# Patient Record
Sex: Female | Born: 1965 | Race: White | Hispanic: No | Marital: Married | Smoking: Never smoker
Health system: Southern US, Community
[De-identification: ages and names within clinical notes are randomized; demographics above are authoritative.]

## PROBLEM LIST (undated history)

## (undated) DIAGNOSIS — I10 Essential (primary) hypertension: Secondary | ICD-10-CM

## (undated) DIAGNOSIS — K219 Gastro-esophageal reflux disease without esophagitis: Secondary | ICD-10-CM

## (undated) DIAGNOSIS — E785 Hyperlipidemia, unspecified: Secondary | ICD-10-CM

## (undated) DIAGNOSIS — F32A Depression, unspecified: Secondary | ICD-10-CM

## (undated) DIAGNOSIS — F419 Anxiety disorder, unspecified: Secondary | ICD-10-CM

## (undated) HISTORY — DX: Essential (primary) hypertension: I10

## (undated) HISTORY — DX: Gastro-esophageal reflux disease without esophagitis: K21.9

## (undated) HISTORY — PX: ABDOMINAL HYSTERECTOMY: SHX81

## (undated) HISTORY — DX: Hyperlipidemia, unspecified: E78.5

---

## 2021-05-29 ENCOUNTER — Other Ambulatory Visit: Payer: Self-pay | Admitting: Surgery

## 2021-05-29 ENCOUNTER — Other Ambulatory Visit (HOSPITAL_COMMUNITY): Payer: Self-pay | Admitting: Surgery

## 2021-05-29 DIAGNOSIS — K219 Gastro-esophageal reflux disease without esophagitis: Secondary | ICD-10-CM

## 2021-05-29 DIAGNOSIS — I10 Essential (primary) hypertension: Secondary | ICD-10-CM

## 2021-05-29 DIAGNOSIS — Z6835 Body mass index (BMI) 35.0-35.9, adult: Secondary | ICD-10-CM

## 2021-05-29 DIAGNOSIS — E785 Hyperlipidemia, unspecified: Secondary | ICD-10-CM

## 2021-06-26 ENCOUNTER — Telehealth: Payer: Self-pay | Admitting: Neurology

## 2021-06-26 NOTE — Telephone Encounter (Signed)
Sleep consult cancelled due to MD out, VM box full/mychart msg sent informing pt.

## 2021-07-09 ENCOUNTER — Encounter: Payer: BC Managed Care – PPO | Attending: Surgery | Admitting: Skilled Nursing Facility1

## 2021-07-09 ENCOUNTER — Encounter: Payer: Self-pay | Admitting: Skilled Nursing Facility1

## 2021-07-09 ENCOUNTER — Encounter: Payer: Self-pay | Admitting: Neurology

## 2021-07-09 ENCOUNTER — Other Ambulatory Visit: Payer: Self-pay

## 2021-07-09 DIAGNOSIS — E669 Obesity, unspecified: Secondary | ICD-10-CM | POA: Insufficient documentation

## 2021-07-09 NOTE — Progress Notes (Signed)
Nutrition Assessment for Bariatric Surgery Medical Nutrition Therapy Appt Start Time: 3:07    End Time: 4:08  Patient was seen on 07/09/2021 for Pre-Operative Nutrition Assessment. Letter of approval faxed to [facility] bariatric surgery program coordinator on 07/09/2021.   Referral stated Supervised Weight Loss (SWL) visits needed: 0  Pt completed visits.   Pt has cleared nutrition requirements.   Planned surgery: sleeve gastrectomy  Pt expectation of surgery: to lose weight Pt expectation of dietitian: none identified     NUTRITION ASSESSMENT   Anthropometrics  Start weight at [facility]: 190 lbs (date: 07/09/2021)  Height: 60 in BMI: 37.11 kg/m2     Clinical  Medical hx: GERD, hyperlipidemia, HTN Medications: see list; omeprazole   Labs: total cholesterol 351, triglycerides 252, A1C 5.8, vitamin D 26.7 Notable signs/symptoms: constipation  Any previous deficiencies? No  Micronutrient Nutrition Focused Physical Exam: Hair: No issues observed Eyes: No issues observed Mouth: No issues observed Neck: No issues observed Nails: No issues observed Skin: No issues observed  Lifestyle & Dietary Hx  Pt states she has to take her reflux medicine every day and if she does not she will have very bad reflux.   Pt states she has started eating probiotic yogurt + granola which has helped her have more consistent bowel habits.  Pt states she recognizes her excess weight is due to larger portion sizes.   24-Hr Dietary Recall First Meal: protein shake Snack:  Second Meal: probiotic yogurt + protein granola or salad  Snack: grapes Third Meal: chicken or pork chop or manicotti + salad Snack: popcorn or chips or ice cream Beverages: coffee + cream + sugar, unsweet tea, water, diet soda   Estimated Energy Needs Calories: 1500   NUTRITION DIAGNOSIS  Overweight/obesity (Silo-3.3) related to past poor dietary habits and physical inactivity as evidenced by patient w/ planned  sleeve gastrectomy surgery following dietary guidelines for continued weight loss.    NUTRITION INTERVENTION  Nutrition counseling (C-1) and education (E-2) to facilitate bariatric surgery goals.  Educated pt on micronutrient deficiencies post surgery and strategies to mitigate that risk   Pre-Op Goals Reviewed with the Patient Track food and beverage intake (pen and paper, MyFitness Pal, Baritastic app, etc.) Make healthy food choices while monitoring portion sizes Consume 3 meals per day or try to eat every 3-5 hours Avoid concentrated sugars and fried foods Keep sugar & fat in the single digits per serving on food labels Practice CHEWING your food (aim for applesauce consistency) Practice not drinking 15 minutes before, during, and 30 minutes after each meal and snack Avoid all carbonated beverages (ex: soda, sparkling beverages)  Limit caffeinated beverages (ex: coffee, tea, energy drinks) Avoid all sugar-sweetened beverages (ex: regular soda, sports drinks)  Avoid alcohol  Aim for 64-100 ounces of FLUID daily (with at least half of fluid intake being plain water)  Aim for at least 60-80 grams of PROTEIN daily Look for a liquid protein source that contains ?15 g protein and ?5 g carbohydrate (ex: shakes, drinks, shots) Make a list of non-food related activities Physical activity is an important part of a healthy lifestyle so keep it moving! The goal is to reach 150 minutes of exercise per week, including cardiovascular and weight baring activity.  *Goals that are bolded indicate the pt would like to start working towards these  Handouts Provided Include  Bariatric Surgery handouts (Nutrition Visits, Pre-Op Goals, Protein Shakes, Vitamins & Minerals)  Learning Style & Readiness for Change Teaching method utilized: Visual &  Auditory  Demonstrated degree of understanding via: Teach Back  Readiness Level: Action Barriers to learning/adherence to lifestyle change: non identified       MONITORING & EVALUATION Dietary intake, weekly physical activity, body weight, and pre-op goals reached at next nutrition visit.    Next Steps  Patient is to follow up at [facility] for Pre-Op Class >2 weeks before surgery for further nutrition education.   Pt has completed visits. No further supervised visits required/recomended

## 2021-07-17 ENCOUNTER — Ambulatory Visit: Payer: Self-pay | Admitting: Surgery

## 2021-08-09 ENCOUNTER — Institutional Professional Consult (permissible substitution): Payer: BC Managed Care – PPO | Admitting: Neurology

## 2021-08-14 ENCOUNTER — Encounter: Payer: Self-pay | Admitting: Neurology

## 2021-08-14 ENCOUNTER — Ambulatory Visit: Payer: BC Managed Care – PPO | Admitting: Neurology

## 2021-08-14 VITALS — BP 153/83 | HR 73 | Ht 60.0 in | Wt 188.8 lb

## 2021-08-14 DIAGNOSIS — G4719 Other hypersomnia: Secondary | ICD-10-CM | POA: Diagnosis not present

## 2021-08-14 DIAGNOSIS — Z9189 Other specified personal risk factors, not elsewhere classified: Secondary | ICD-10-CM

## 2021-08-14 DIAGNOSIS — E669 Obesity, unspecified: Secondary | ICD-10-CM | POA: Diagnosis not present

## 2021-08-14 DIAGNOSIS — R0683 Snoring: Secondary | ICD-10-CM | POA: Diagnosis not present

## 2021-08-14 DIAGNOSIS — R030 Elevated blood-pressure reading, without diagnosis of hypertension: Secondary | ICD-10-CM

## 2021-08-14 NOTE — Progress Notes (Signed)
Subjective:    Patient ID: Kimberly Medina is a 55 y.o. female.  HPI    Huston Foley, MD, PhD [facility] [address] [address] [place], [place] [address]  Dear Dr. Dossie Der,   I saw your patient, Kimberly Medina, upon your kind request, in my Sleep clinic today for initial consultation of her sleep disorder, in particular, concern for underlying obstructive sleep apnea.  The patient is unaccompanied today.  As you know, Kimberly Medina is a 55 year old right-handed woman with an underlying medical history of reflux disease, higher blood pressure values, with no medication for hypertension, depression, anxiety, hyperlipidemia, and obesity, who reports snoring and excessive daytime somnolence.  I reviewed your office note from 05/23/2021.  Kimberly Medina is being considered for sleeve gastrectomy.  Her Epworth sleepiness score is 10 out of 24, fatigue severity score is 18 out of 63.  Kimberly Medina has had elevated blood pressure values.  Kimberly Medina is currently not on blood pressure medication but does take reflux medication, Effexor, and atorvastatin.  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  Kimberly Medina typically goes to bed around 10 PM and rise time is between 5:30 AM and 5:45 AM.  Kimberly Medina lives with her husband.  Her daughter is 58 years old.  Her older son sadly died in 21-Feb-2017 at the age of 14 in a car accident.  Kimberly Medina does not watch TV in her bedroom once Kimberly Medina is ready to fall asleep.  Kimberly Medina had a sleep study years ago, over 15 years ago and reports that Kimberly Medina was advised to come back for second study, Kimberly Medina did not pursue treatment for sleep apnea at the time.  Her snoring is loud per family.  Kimberly Medina is not aware of any family history of sleep apnea.  Her Past Medical History Is Significant For: Past Medical History:  Diagnosis Date   GERD (gastroesophageal reflux disease)    Hyperlipidemia    Hypertension     Her Past Surgical History Is Significant For: Past Surgical History:  Procedure Laterality Date    ABDOMINAL HYSTERECTOMY     CESAREAN SECTION      Her Family History Is Significant For: Family History  Problem Relation Age of Onset   Sleep apnea Neg Hx     Her Social History Is Significant For: Social History   Socioeconomic History   Marital status: Married    Spouse name: Not on file   Number of children: Not on file   Years of education: Not on file   Highest education level: Not on file  Occupational History   Not on file  Tobacco Use   Smoking status: Never    Passive exposure: Never   Smokeless tobacco: Never  Vaping Use   Vaping Use: Never used  Substance and Sexual Activity   Alcohol use: Yes    Comment: OCC   Drug use: Never   Sexual activity: Yes    Birth control/protection: Other-see comments  Other Topics Concern   Not on file  Social History Narrative   Not on file   Social Determinants of Health   Financial Resource Strain: Not on file  Food Insecurity: Not on file  Transportation Needs: Not on file  Physical Activity: Not on file  Stress: Not on file  Social Connections: Not on file    Her Allergies Are:  No Known Allergies:   Her Current Medications Are:  Outpatient Encounter Medications as of 08/14/2021  Medication Sig   atorvastatin (LIPITOR) 10 MG tablet  Take 10 mg by mouth daily.   fluticasone (FLONASE) 50 MCG/ACT nasal spray Place 1 spray into both nostrils 1 day or 1 dose.   loratadine (CLARITIN) 10 MG tablet Take 10 mg by mouth once.   omeprazole (PRILOSEC) 40 MG capsule Take 40 capsules by mouth once.   valACYclovir (VALTREX) 500 MG tablet Take 500 mg by mouth as needed.   venlafaxine (EFFEXOR) 100 MG tablet Take 100 mg by mouth once.   No facility-administered encounter medications on file as of 08/14/2021.  :   Review of Systems:  Out of a complete 14 point review of systems, all are reviewed and negative with the exception of these symptoms as listed below:  Review of Systems  Neurological:        Pt states Kimberly Medina is  here for consult for sleep study for gastric sleeve. Pt states Kimberly Medina snores and pt states her blood pressure is elevated when Kimberly Medina goes to her PCP . But not diagnosed has hypertension   FSS 18 ESS 10   Objective:  Neurological Exam  Physical Exam Physical Examination:   Vitals:   08/14/21 1524  BP: (!) 153/83  Pulse: 73    General Examination: The patient is a very pleasant 55 y.o. female in no acute distress. Kimberly Medina appears well-developed and well-nourished and well groomed.   HEENT: Normocephalic, atraumatic, pupils are equal, round and reactive to light, extraocular tracking is good without limitation to gaze excursion or nystagmus noted. Hearing is grossly intact. Face is symmetric with normal facial animation. Speech is clear with no dysarthria noted. There is no hypophonia. There is no lip, neck/head, jaw or voice tremor. Neck is supple with full range of passive and active motion. There are no carotid bruits on auscultation. Oropharynx exam reveals: mild mouth dryness, good dental hygiene and moderate airway crowding, due to small airway entry, tonsillar size about 1+ bilaterally.  Mallampati class III, neck circumference of 15-1/4 inches.  Tongue protrudes centrally and palate elevates symmetrically.  Chest: Clear to auscultation without wheezing, rhonchi or crackles noted.  Heart: S1+S2+0, regular and normal without murmurs, rubs or gallops noted.   Abdomen: Soft, non-tender and non-distended with normal bowel sounds appreciated on auscultation.  Extremities: There is no pitting edema in the distal lower extremities bilaterally.   Skin: Warm and dry without trophic changes noted.   Musculoskeletal: exam reveals no obvious joint deformities, tenderness or joint swelling or erythema.   Neurologically:  Mental status: The patient is awake, alert and oriented in all 4 spheres. Her immediate and remote memory, attention, language skills and fund of knowledge are appropriate. There is  no evidence of aphasia, agnosia, apraxia or anomia. Speech is clear with normal prosody and enunciation. Thought process is linear. Mood is normal and affect is normal.  Cranial nerves II - XII are as described above under HEENT exam.  Motor exam: Normal bulk, strength and tone is noted. There is no tremor, Romberg is negative. Fine motor skills and coordination: grossly intact.  Cerebellar testing: No dysmetria or intention tremor. There is no truncal or gait ataxia.  Sensory exam: intact to light touch in the upper and lower extremities.  Gait, station and balance: Kimberly Medina stands easily. No veering to one side is noted. No leaning to one side is noted. Posture is age-appropriate and stance is narrow based. Gait shows normal stride length and normal pace. No problems turning are noted.   Assessment and Plan:  In summary, Madisin Hasan is a very pleasant  55 y.o.-year old female with an underlying medical history of reflux disease, higher blood pressure values, with no medication for hypertension, depression, anxiety, hyperlipidemia, and obesity, whose history and physical exam are concerning for obstructive sleep apnea (OSA). I had a long chat with the patient about my findings and the diagnosis of OSA, its prognosis and treatment options. We talked about medical treatments, surgical interventions and non-pharmacological approaches. I explained in particular the risks and ramifications of untreated moderate to severe OSA, especially with respect to developing cardiovascular disease down the Road, including congestive heart failure, difficult to treat hypertension, cardiac arrhythmias, or stroke. Even type 2 diabetes has, in part, been linked to untreated OSA. Symptoms of untreated OSA include daytime sleepiness, memory problems, mood irritability and mood disorder such as depression and anxiety, lack of energy, as well as recurrent headaches, especially morning headaches. We talked about trying to maintain a  healthy lifestyle in general, as well as the importance of weight control. We also talked about the importance of good sleep hygiene. I recommended the following at this time: sleep study.  I outlined the difference between a laboratory attended sleep study versus home sleep test. I explained the sleep test procedure to the patient and also outlined possible surgical and non-surgical treatment options of OSA, including the use of a custom-made dental device (which would require a referral to a specialist dentist or oral surgeon), upper airway surgical options, such as traditional UPPP or a novel less invasive surgical option in the form of Inspire hypoglossal nerve stimulation (which would involve a referral to an ENT surgeon). I also explained the CPAP treatment option to the patient, who indicated that Kimberly Medina would be willing to try CPAP if the need arises. I explained the importance of being compliant with PAP treatment, not only for insurance purposes but primarily to improve Her symptoms, and for the patient's long term health benefit, including to reduce Her cardiovascular risks. I answered all her questions today and the patient was in agreement. I plan to see her back after the sleep study is completed and encouraged her to call with any interim questions, concerns, problems or updates.   Thank you very much for allowing me to participate in the care of this nice patient. If I can be of any further assistance to you please do not hesitate to call me at [phone].  Sincerely,   Huston Foley, MD, PhD

## 2021-08-14 NOTE — Patient Instructions (Signed)

## 2021-08-21 ENCOUNTER — Encounter: Payer: Self-pay | Admitting: Neurology

## 2021-08-22 ENCOUNTER — Telehealth: Payer: Self-pay

## 2021-08-22 NOTE — Telephone Encounter (Signed)
LVM for pt to call me back to schedule sleep study  

## 2021-08-23 ENCOUNTER — Other Ambulatory Visit (HOSPITAL_COMMUNITY): Payer: Self-pay | Admitting: Surgery

## 2021-08-23 ENCOUNTER — Encounter: Payer: Self-pay | Admitting: Neurology

## 2021-08-23 DIAGNOSIS — E785 Hyperlipidemia, unspecified: Secondary | ICD-10-CM

## 2021-08-23 DIAGNOSIS — I10 Essential (primary) hypertension: Secondary | ICD-10-CM

## 2021-08-23 DIAGNOSIS — Z6835 Body mass index (BMI) 35.0-35.9, adult: Secondary | ICD-10-CM

## 2021-08-23 DIAGNOSIS — Z9884 Bariatric surgery status: Secondary | ICD-10-CM

## 2021-08-23 DIAGNOSIS — K219 Gastro-esophageal reflux disease without esophagitis: Secondary | ICD-10-CM

## 2021-08-24 ENCOUNTER — Encounter (HOSPITAL_COMMUNITY): Payer: Self-pay | Admitting: Surgery

## 2021-08-24 NOTE — Progress Notes (Signed)
Attempted to obtain medical history via telephone, unable to reach at this time. I left a voicemail to return pre surgical testing department's phone call.  

## 2021-08-25 ENCOUNTER — Encounter: Payer: Self-pay | Admitting: Neurology

## 2021-09-03 ENCOUNTER — Ambulatory Visit (INDEPENDENT_AMBULATORY_CARE_PROVIDER_SITE_OTHER): Payer: BC Managed Care – PPO | Admitting: Neurology

## 2021-09-03 DIAGNOSIS — G4733 Obstructive sleep apnea (adult) (pediatric): Secondary | ICD-10-CM

## 2021-09-03 DIAGNOSIS — E669 Obesity, unspecified: Secondary | ICD-10-CM

## 2021-09-03 DIAGNOSIS — Z9189 Other specified personal risk factors, not elsewhere classified: Secondary | ICD-10-CM

## 2021-09-03 DIAGNOSIS — G4719 Other hypersomnia: Secondary | ICD-10-CM

## 2021-09-03 DIAGNOSIS — R030 Elevated blood-pressure reading, without diagnosis of hypertension: Secondary | ICD-10-CM

## 2021-09-03 DIAGNOSIS — R0683 Snoring: Secondary | ICD-10-CM

## 2021-09-03 NOTE — Anesthesia Preprocedure Evaluation (Addendum)
Anesthesia Evaluation  Patient identified by MRN, date of birth, ID band Patient awake    Reviewed: Allergy & Precautions, NPO status , Patient's Chart, lab work & pertinent test results  Airway Mallampati: II  TM Distance: >3 FB Neck ROM: Full    Dental no notable dental hx.    Pulmonary neg pulmonary ROS,    Pulmonary exam normal        Cardiovascular hypertension,  Rhythm:Regular Rate:Normal     Neuro/Psych negative neurological ROS  negative psych ROS   GI/Hepatic Neg liver ROS, GERD  Medicated,  Endo/Other  negative endocrine ROS  Renal/GU negative Renal ROS  negative genitourinary   Musculoskeletal negative musculoskeletal ROS (+)   Abdominal (+)  Abdomen: soft.    Peds  Hematology negative hematology ROS (+)   Anesthesia Other Findings   Reproductive/Obstetrics                            Anesthesia Physical Anesthesia Plan  ASA: 2  Anesthesia Plan: MAC   Post-op Pain Management:    Induction: Intravenous  PONV Risk Score and Plan: 2 and Propofol infusion and Treatment may vary due to age or medical condition  Airway Management Planned: Simple Face Mask, Natural Airway and Nasal Cannula  Additional Equipment: None  Intra-op Plan:   Post-operative Plan:   Informed Consent: I have reviewed the patients History and Physical, chart, labs and discussed the procedure including the risks, benefits and alternatives for the proposed anesthesia with the patient or authorized representative who has indicated his/her understanding and acceptance.     Dental advisory given  Plan Discussed with: CRNA  Anesthesia Plan Comments:        Anesthesia Quick Evaluation

## 2021-09-04 ENCOUNTER — Encounter (HOSPITAL_COMMUNITY)
Admission: RE | Disposition: A | Discharge status: Home / Self Care | Payer: Self-pay | Source: Ambulatory Visit | Attending: Surgery

## 2021-09-04 ENCOUNTER — Ambulatory Visit (HOSPITAL_COMMUNITY): Payer: BC Managed Care – PPO | Admitting: Anesthesiology

## 2021-09-04 ENCOUNTER — Other Ambulatory Visit: Payer: Self-pay

## 2021-09-04 ENCOUNTER — Ambulatory Visit (HOSPITAL_COMMUNITY)
Admission: RE | Admit: 2021-09-04 | Discharge: 2021-09-04 | Disposition: A | Discharge status: Home / Self Care | Payer: BC Managed Care – PPO | Source: Ambulatory Visit | Attending: Surgery | Admitting: Surgery

## 2021-09-04 ENCOUNTER — Encounter (HOSPITAL_COMMUNITY): Payer: Self-pay | Admitting: Surgery

## 2021-09-04 DIAGNOSIS — K449 Diaphragmatic hernia without obstruction or gangrene: Secondary | ICD-10-CM | POA: Diagnosis not present

## 2021-09-04 DIAGNOSIS — K219 Gastro-esophageal reflux disease without esophagitis: Secondary | ICD-10-CM

## 2021-09-04 DIAGNOSIS — M1990 Unspecified osteoarthritis, unspecified site: Secondary | ICD-10-CM | POA: Insufficient documentation

## 2021-09-04 DIAGNOSIS — R12 Heartburn: Secondary | ICD-10-CM | POA: Insufficient documentation

## 2021-09-04 DIAGNOSIS — E785 Hyperlipidemia, unspecified: Secondary | ICD-10-CM | POA: Insufficient documentation

## 2021-09-04 DIAGNOSIS — Z6836 Body mass index (BMI) 36.0-36.9, adult: Secondary | ICD-10-CM | POA: Insufficient documentation

## 2021-09-04 DIAGNOSIS — Z6835 Body mass index (BMI) 35.0-35.9, adult: Secondary | ICD-10-CM

## 2021-09-04 DIAGNOSIS — I10 Essential (primary) hypertension: Secondary | ICD-10-CM

## 2021-09-04 HISTORY — PX: ESOPHAGOGASTRODUODENOSCOPY: SHX5428

## 2021-09-04 HISTORY — PX: BIOPSY: SHX5522

## 2021-09-04 SURGERY — EGD (ESOPHAGOGASTRODUODENOSCOPY)
Anesthesia: Monitor Anesthesia Care

## 2021-09-04 MED ORDER — LIDOCAINE 2% (20 MG/ML) 5 ML SYRINGE
INTRAMUSCULAR | Status: DC | PRN
  Administered 2021-09-04: 80 mg via INTRAVENOUS

## 2021-09-04 MED ORDER — PROPOFOL 10 MG/ML IV BOLUS
INTRAVENOUS | Status: DC | PRN
  Administered 2021-09-04: 20 mg via INTRAVENOUS
  Administered 2021-09-04: 50 mg via INTRAVENOUS
  Administered 2021-09-04: 30 mg via INTRAVENOUS

## 2021-09-04 MED ORDER — LACTATED RINGERS IV SOLN: INTRAVENOUS | Status: DC | PRN

## 2021-09-04 MED ORDER — PROPOFOL 500 MG/50ML IV EMUL
INTRAVENOUS | Status: DC | PRN
  Administered 2021-09-04: 100 ug/kg/min via INTRAVENOUS

## 2021-09-04 MED ORDER — OMEPRAZOLE 40 MG PO CPDR: 40.0000 mg | DELAYED_RELEASE_CAPSULE | Freq: Every day | ORAL | 0 refills | Status: AC

## 2021-09-04 NOTE — Discharge Instructions (Signed)
YOU HAD AN ENDOSCOPIC PROCEDURE TODAY: Refer to the procedure report and other information in the discharge instructions given to you for any specific questions about what was found during the examination. If this information does not answer your questions, please call [facility] office at [phone] to clarify.   YOU SHOULD EXPECT: Some feelings of bloating in the abdomen. Passage of more gas than usual. Walking can help get rid of the air that was put into your GI tract during the procedure and reduce the bloating. If you had a lower endoscopy (such as a colonoscopy or flexible sigmoidoscopy) you may notice spotting of blood in your stool or on the toilet paper. Some abdominal soreness may be present for a day or two, also.  DIET: Your first meal following the procedure should be a light meal and then it is ok to progress to your normal diet. A half-sandwich or bowl of soup is an example of a good first meal. Heavy or fried foods are harder to digest and may make you feel nauseous or bloated. Drink plenty of fluids but you should avoid alcoholic beverages for 24 hours. If you had a esophageal dilation, please see attached instructions for diet.    ACTIVITY: Your care partner should take you home directly after the procedure. You should plan to take it easy, moving slowly for the rest of the day. You can resume normal activity the day after the procedure however YOU SHOULD NOT DRIVE, use power tools, machinery or perform tasks that involve climbing or major physical exertion for 24 hours (because of the sedation medicines used during the test).   SYMPTOMS TO REPORT IMMEDIATELY: A general surgeon can be reached at any hour. Please call [phone]  for any of the following symptoms:  Following lower endoscopy (colonoscopy, flexible sigmoidoscopy) Excessive amounts of blood in the stool  Significant tenderness, worsening of abdominal pains  Swelling of the abdomen that is new, acute   Fever of 100 or higher  Following upper endoscopy (EGD, EUS, ERCP, esophageal dilation) Vomiting of blood or coffee ground material  New, significant abdominal pain  New, significant chest pain or pain under the shoulder blades  Painful or persistently difficult swallowing  New shortness of breath  Black, tarry-looking or red, bloody stools  FOLLOW UP:  If any biopsies were taken you will be contacted by phone or by letter within the next 1-3 weeks. Call [phone]  if you have not heard about the biopsies in 3 weeks.  Please also call with any specific questions about appointments or follow up tests. 

## 2021-09-04 NOTE — Transfer of Care (Signed)
Immediate Anesthesia Transfer of Care Note  Patient: Kimberly Medina  Procedure(s) Performed: Procedure(s): ESOPHAGOGASTRODUODENOSCOPY (EGD) (N/A) BIOPSY  Patient Location: PACU  Anesthesia Type:MAC  Level of Consciousness: Patient easily awoken, sedated, comfortable, cooperative, following commands, responds to stimulation.   Airway & Oxygen Therapy: Patient spontaneously breathing, ventilating well, oxygen via simple oxygen mask.  Post-op Assessment: Report given to PACU RN, vital signs reviewed and stable, moving all extremities.   Post vital signs: Reviewed and stable.  Complications: No apparent anesthesia complications  Last Vitals:  Vitals Value Taken Time  BP    Temp    Pulse 76 09/04/21 0743  Resp 16 09/04/21 0743  SpO2 100 % 09/04/21 0743  Vitals shown include unvalidated device data.  Last Pain:  Vitals:   09/04/21 0640  TempSrc: Oral  PainSc: 0-No pain         Complications: No notable events documented.

## 2021-09-04 NOTE — Anesthesia Procedure Notes (Signed)
Procedure Name: MAC Date/Time: 09/04/2021 7:24 AM Performed by: Deliah Boston, CRNA Pre-anesthesia Checklist: Patient identified, Emergency Drugs available, Suction available and Patient being monitored Patient Re-evaluated:Patient Re-evaluated prior to induction Oxygen Delivery Method: Simple face mask Placement Confirmation: positive ETCO2

## 2021-09-04 NOTE — Op Note (Signed)
[facility] Patient Name: Kimberly Medina Procedure Date: 09/04/2021 MRN: [id number] Attending MD: Felicie Morn ,  Date of Birth: December 07, 1965 CSN: [id number] Age: 55 Admit Type: Outpatient Procedure:                Upper GI endoscopy Indications:              Heartburn Providers:                Felicie Morn, Dulcy Fanny, Luan Moore, Technician Referring MD:              Medicines:                Monitored Anesthesia Care Complications:            No immediate complications. Estimated Blood Loss:     Estimated blood loss was minimal. Procedure:                Pre-Anesthesia Assessment:                           - Prior to the procedure, a History and Physical                            was performed, and patient medications and                            allergies were reviewed. The patient is competent.                            The risks and benefits of the procedure and the                            sedation options and risks were discussed with the                            patient. All questions were answered and informed                            consent was obtained. Patient identification and                            proposed procedure were verified. Mental Status                            Examination: alert and oriented. Airway                            Examination: normal oropharyngeal airway and neck                            mobility. Respiratory Examination: clear to                            auscultation. CV Examination: normal. ASA Grade  Assessment: II - A patient with mild systemic                            disease. After reviewing the risks and benefits,                            the patient was deemed in satisfactory condition to                            undergo the procedure. The anesthesia plan was to                            use monitored anesthesia care  (MAC). Immediately                            prior to administration of medications, the patient                            was re-assessed for adequacy to receive sedatives.                            The heart rate, respiratory rate, oxygen                            saturations, blood pressure, adequacy of pulmonary                            ventilation, and response to care were monitored                            throughout the procedure. The physical status of                            the patient was re-assessed after the procedure.                           After obtaining informed consent, the endoscope was                            passed under direct vision. Throughout the                            procedure, the patient's blood pressure, pulse, and                            oxygen saturations were monitored continuously. The                            [id number]-[id number] ([id number] Olympus endoscope was introduced                            through the mouth, and advanced to the second part  of duodenum. The upper GI endoscopy was                            accomplished without difficulty. The patient                            tolerated the procedure well. Scope In: Scope Out: Findings:      The gastroesophageal flap valve was visualized endoscopically and       classified as Hill Grade IV (no fold, wide open lumen, hiatal hernia       present).      A medium-sized sliding hiatal hernia was found.      The in the duodenum was normal.      The gastric antrum was normal. Biopsies were taken with a cold forceps       for Helicobacter pylori testing. Impression:               - Gastroesophageal flap valve classified as Hill                            Grade IV (no fold, wide open lumen, hiatal hernia                            present).                           - Medium-sized sliding hiatal hernia.                           - Normal.                            - Normal antrum. Biopsied. Moderate Sedation:      Moderate (conscious) sedation was personally administered by an       anesthesia professional. The following parameters were monitored: oxygen       saturation, heart rate, blood pressure, and response to care. Total       physician intraservice time was 15 minutes. Recommendation:           - Await pathology results. Procedure Code(s):        --- Professional ---                           [phone], Esophagogastroduodenoscopy, flexible,                            transoral; with biopsy, single or multiple Diagnosis Code(s):        --- Professional ---                           K44.9, Diaphragmatic hernia without obstruction or                            gangrene                           R12, Heartburn CPT copyright 2019 American Medical Association. All rights reserved. The codes documented in this report  are preliminary and upon coder review may  be revised to meet current compliance requirements. Mize,  09/04/2021 7:45:13 AM This report has been signed electronically. Number of Addenda: 0

## 2021-09-04 NOTE — H&P (Signed)
Admitting Physician: Hyman Hopes Kenndra Morris  Service: Bariatric surgery  CC: Morbid obesity, GERD  Subjective   HPI: Kimberly Medina is an 55 y.o. female who is here for elective EGD prior to bariatric surgery  Past Medical History:  Diagnosis Date   GERD (gastroesophageal reflux disease)    Hyperlipidemia    Hypertension     Past Surgical History:  Procedure Laterality Date   ABDOMINAL HYSTERECTOMY     CESAREAN SECTION      Family History  Problem Relation Age of Onset   Sleep apnea Neg Hx     Social:  reports that she has never smoked. She has never been exposed to tobacco smoke. She has never used smokeless tobacco. She reports current alcohol use. She reports that she does not use drugs.  Allergies: No Known Allergies  Medications: Current Outpatient Medications  Medication Instructions   atorvastatin (LIPITOR) 20 mg, Oral, Daily at bedtime   fluticasone (FLONASE) 50 MCG/ACT nasal spray 1 spray, Each Nare, Daily   Halobetasol Propionate (LEXETTE) 0.05 % FOAM 1 application, Topical, Daily   loratadine (CLARITIN) 10 mg, Oral, Daily   Omega 3 1,000 mg, Oral, 2 times daily   omeprazole (PRILOSEC) 40 MG capsule 40 capsules, Oral, Daily   valACYclovir (VALTREX) 1,000 mg, Oral, As needed   venlafaxine (EFFEXOR) 100 mg, Oral, Daily   Vitamin D3 4,000 Units, Oral, Daily, Gummie    ROS - all of the below systems have been reviewed with the patient and positives are indicated with bold text General: chills, fever or night sweats Eyes: blurry vision or double vision ENT: epistaxis or sore throat Allergy/Immunology: itchy/watery eyes or nasal congestion Hematologic/Lymphatic: bleeding problems, blood clots or swollen lymph nodes Endocrine: temperature intolerance or unexpected weight changes Breast: new or changing breast lumps or nipple discharge Resp: cough, shortness of breath, or wheezing CV: chest pain or dyspnea on exertion GI: as per HPI GU: dysuria, trouble  voiding, or hematuria MSK: joint pain or joint stiffness Neuro: TIA or stroke symptoms Derm: pruritus and skin lesion changes Psych: anxiety and depression  Objective   PE There were no vitals taken for this visit. Constitutional: NAD; conversant; no deformities Eyes: Moist conjunctiva; no lid lag; anicteric; PERRL Neck: Trachea midline; no thyromegaly Lungs: Normal respiratory effort; no tactile fremitus CV: RRR; no palpable thrills; no pitting edema GI: Abd Soft, non-tender; no palpable hepatosplenomegaly MSK: Normal range of motion of extremities; no clubbing/cyanosis Psychiatric: Appropriate affect; alert and oriented x3 Lymphatic: No palpable cervical or axillary lymphadenopathy  No results found for this or any previous visit (from the past 24 hour(s)).  Imaging Orders  No imaging studies ordered today     Assessment and Plan   Kimberly Medina is a 55 y.o. female who was seen for bariatric surgery consultation. The patient has morbid obesity with a BMI of Body mass index is 35.39 kg/m. and the following conditions related to obesity: Hyperlipidemia, hypertension, gastroesophageal reflux disease, arthritis.  Today we discussed the surgical options to treat obesity and its associated comorbidity. After discussing the available procedures in the region, we discussed in great detail the surgeries I offer: robotic sleeve gastrectomy and robotic roux-en-y gastric bypass. We discussed the procedures themselves as well as their risks, benefits and alternatives. I entered the patient's basic information into the [facility] Metabolic Surgery Risk/Benefit Calculator to facilitate this discussion.  After a full discussion and all questions answered, the patient is interested in pursuing a robotic sleeve gastrectomy.  We will initiate  the bariatric surgery preoperative pathway to include the following: - Bloodwork - Dietician consult - Completed with Serena Colonel - Chest x-ray -  EKG - Psychology evaluation - Sleep study - Upper endoscopy with biopsy. I explained my rational for performing upper endoscopy in my bariatric patients. During the procedure I will biopsy for H. Pylori, evaluate for hiatal hernia, evaluate for reflux esophagitis and look for any other abnormalities that may influence the procedure. We discussed the risks, benefits and alternative to this procedure and the patient granted consent to proceed.  Today we will proceed with EGD   After the above evaluation is complete, we will meet to continue our discussion and work towards scheduling surgery.     Quentin Ore, MD  [facility] Use [email or web address] to contact on call provider

## 2021-09-04 NOTE — Anesthesia Postprocedure Evaluation (Signed)
Anesthesia Post Note  Patient: Kimberly Medina  Procedure(s) Performed: ESOPHAGOGASTRODUODENOSCOPY (EGD) BIOPSY     Patient location during evaluation: Endoscopy Anesthesia Type: MAC Level of consciousness: awake and alert Pain management: pain level controlled Vital Signs Assessment: post-procedure vital signs reviewed and stable Respiratory status: spontaneous breathing, nonlabored ventilation, respiratory function stable and patient connected to nasal cannula oxygen Cardiovascular status: stable and blood pressure returned to baseline Postop Assessment: no apparent nausea or vomiting Anesthetic complications: no   No notable events documented.  Last Vitals:  Vitals:   09/04/21 0800 09/04/21 0806  BP: 138/71 (!) 154/79  Pulse: 69 72  Resp: 18 16  Temp:    SpO2: 100% 95%    Last Pain:  Vitals:   09/04/21 0806  TempSrc:   PainSc: 0-No pain                 Kimberly Medina

## 2021-09-05 ENCOUNTER — Encounter (HOSPITAL_COMMUNITY): Payer: Self-pay | Admitting: Surgery

## 2021-09-05 LAB — SURGICAL PATHOLOGY

## 2021-09-06 ENCOUNTER — Encounter: Payer: Self-pay | Admitting: Neurology

## 2021-09-10 NOTE — Progress Notes (Signed)
See procedure note.

## 2021-09-11 NOTE — Addendum Note (Signed)
Addended by: Huston Foley on: 09/11/2021 04:46 PM   Modules accepted: Orders

## 2021-09-11 NOTE — Procedures (Signed)
   [facility]  HOME SLEEP TEST (Watch PAT) REPORT  STUDY DATE: 09/03/2021  DOB: [birth year]  MRN: [id number]  ORDERING CLINICIAN: Huston Foley, MD, PhD   REFERRING CLINICIAN: Dr. Ivar Drape  CLINICAL INFORMATION/HISTORY: 55 year old right-handed woman with an underlying medical history of reflux disease, higher blood pressure values, with no medication for hypertension, depression, anxiety, hyperlipidemia, and obesity, who reports snoring and excessive daytime somnolence.   Epworth sleepiness score: 10/24.  BMI: 36.8 kg/m  FINDINGS:   Sleep Summary:   Total Recording Time (hours, min): 8 hours, 17 minutes  Total Sleep Time (hours, min):  6 hours, 57 minutes   Percent REM (%):    19.2%   Respiratory Indices:   Calculated pAHI (per hour):  24.5/hour         REM pAHI:    21.2/hour       NREM pAHI: 25.3/hour  Oxygen Saturation Statistics:    Oxygen Saturation (%) Mean: 95%   Minimum oxygen saturation (%):                 88%   O2 Saturation Range (%): 88-99%    O2 Saturation (minutes) <=88%: 0.3 min  Pulse Rate Statistics:   Pulse Mean (bpm):    64/min    Pulse Range (49-87/min)   IMPRESSION: OSA (obstructive sleep apnea)   RECOMMENDATION:  This home sleep test demonstrates moderate obstructive sleep apnea - by number of events - with a total AHI of 24.5/hour and O2 nadir of 88%.  Snoring was detected, mostly in the moderate range. Treatment with positive airway pressure is recommended. The patient will be advised to proceed with an autoPAP titration/trial at home for now. A full night titration study may be considered to optimize treatment settings, if needed down the road. Please note that untreated obstructive sleep apnea may carry additional perioperative morbidity. Patients with significant obstructive sleep apnea should receive perioperative PAP therapy and the surgeons and particularly the anesthesiologist should be informed of the  diagnosis and the severity of the sleep disordered breathing. The patient should be cautioned not to drive, work at heights, or operate dangerous or heavy equipment when tired or sleepy. Review and reiteration of good sleep hygiene measures should be pursued with any patient. Other causes of the patient's symptoms, including circadian rhythm disturbances, an underlying mood disorder, medication effect and/or an underlying medical problem cannot be ruled out based on this test. Clinical correlation is recommended. The patient and her referring provider will be notified of the test results. The patient will be seen in follow up in sleep clinic at [facility].  I certify that I have reviewed the raw data recording prior to the issuance of this report in accordance with the standards of the American Academy of Sleep Medicine (AASM).  INTERPRETING PHYSICIAN:   Huston Foley, MD, PhD  Board Certified in Neurology and Sleep Medicine  [facility] [address] [place], [place] [address] [phone]

## 2021-09-12 ENCOUNTER — Telehealth: Payer: Self-pay | Admitting: *Deleted

## 2021-09-12 ENCOUNTER — Encounter: Payer: Self-pay | Admitting: *Deleted

## 2021-09-12 NOTE — Telephone Encounter (Signed)
-----   Message from Huston Foley, MD sent at 09/11/2021  4:46 PM EDT ----- Patient referred by Dr. Dossie Der with general surgery, seen by me on 08/14/2021, patient had a HST on 09/03/2021.    Please call and notify the patient that the recent home sleep test showed obstructive sleep apnea in the moderate range. I recommend treatment in the form of autoPAP, which means, that we don't have to bring her in for a sleep study with CPAP, but will let her start using a so called autoPAP machine at home, which is a CPAP-like machine with self-adjusting pressures. We will send the order to a local DME company (of her choice, or as per insurance requirement). The DME representative will fit her with a mask, educate her on how to use the machine, how to put the mask on, etc. I have placed an order in the chart. Please send the order, talk to patient, send report to referring MD. We will need a FU in sleep clinic for 10 weeks post-PAP set up, please arrange that with me or one of our NPs. Also reinforce the need for compliance with treatment. Thanks,   Huston Foley, MD, PhD [facility])

## 2021-09-12 NOTE — Telephone Encounter (Signed)
Spoke with patient and discussed her sleep study results.  Patient aware that her home sleep test showed obstructive sleep apnea in the moderate range.  She is aware of the recommended treatment in the form of AutoPap.  We discussed the difference between AutoPap and CPAP.  She would like to use a DME company location closer to her so we will send orders to [employer] [facility]).  Patient aware of compliance requirements by insurance which includes using the machine at least 4 hours every night and also having a follow-up appointment between 30 and 90 days after set on the autoPAP. We scheduled patient for an appointment on Monday, December 10, 2021 at 2:15 PM.  Patient aware to arrive between 15 and 30 minutes early with her machine and power cord.  Her questions were answered.   Letter sent to patient, orders and supporting information faxed to [facility] Received a receipt of confirmation. Results also sent to referring MD.

## 2021-10-29 ENCOUNTER — Encounter: Payer: BC Managed Care – PPO | Attending: Surgery | Admitting: Skilled Nursing Facility1

## 2021-10-29 ENCOUNTER — Other Ambulatory Visit: Payer: Self-pay

## 2021-10-29 DIAGNOSIS — E669 Obesity, unspecified: Secondary | ICD-10-CM | POA: Insufficient documentation

## 2021-10-29 NOTE — Progress Notes (Signed)
Pre-Operative Nutrition Class:    Patient was seen on 10/29/2021 for Pre-Operative Bariatric Surgery Education at the [facility].    Surgery date: 11/26/2021 Surgery type: RYGB Start weight at [facility]: 190 Weight today: 188.7  Samples given per MNT protocol. Patient educated on appropriate usage:  Ensure max exp: January 09, 2022 Ensure max lot: [id number]   Chewable bariatric advantage: advanced multi EA exp: 08/23 Chewable bariatric advantage: advanced multi EA lot: [id number]   Bariatric advantage calcium citrate exp: 02/23 Bariatric advantage calcium citrate lot: [id number]  The following the learning objectives were met by the patient during this course: Identify Pre-Op Dietary Goals and will begin 2 weeks pre-operatively Identify appropriate sources of fluids and proteins  State protein recommendations and appropriate sources pre and post-operatively Identify Post-Operative Dietary Goals and will follow for 2 weeks post-operatively Identify appropriate multivitamin and calcium sources Describe the need for physical activity post-operatively and will follow MD recommendations State when to call healthcare provider regarding medication questions or post-operative complications When having a diagnosis of diabetes understanding hypoglycemia symptoms and the inclusion of 1 complex carbohydrate per meal  Handouts given during class include: Pre-Op Bariatric Surgery Diet Handout Protein Shake Handout Post-Op Bariatric Surgery Nutrition Handout BELT Program Information Flyer Support Group Information Flyer [facility] Bariatric Supplements Price List  Follow-Up Plan: Patient will follow-up at [facility] 2 weeks post operatively for diet advancement per MD.

## 2021-10-30 ENCOUNTER — Encounter: Payer: Self-pay | Admitting: *Deleted

## 2021-10-31 ENCOUNTER — Ambulatory Visit: Payer: BC Managed Care – PPO | Admitting: Adult Health

## 2021-10-31 ENCOUNTER — Encounter: Payer: Self-pay | Admitting: Adult Health

## 2021-10-31 VITALS — BP 153/99 | HR 72 | Ht 60.0 in | Wt 189.4 lb

## 2021-10-31 DIAGNOSIS — G4733 Obstructive sleep apnea (adult) (pediatric): Secondary | ICD-10-CM | POA: Diagnosis not present

## 2021-10-31 DIAGNOSIS — Z9989 Dependence on other enabling machines and devices: Secondary | ICD-10-CM | POA: Diagnosis not present

## 2021-10-31 NOTE — Progress Notes (Signed)
PATIENT: Kimberly Medina DOB: 09-Oct-1966  REASON FOR VISIT: follow up HISTORY FROM: patient   HISTORY OF PRESENT ILLNESS: Today 10/31/21:  Kimberly Medina is a 55 year old female with a history of OSA on CPAP. She returns today for follow-up.  She reports that the CPAP is working well for her.  Reports that she does feel that it has been beneficial for her.  She denies any new issues.     REVIEW OF SYSTEMS: Out of a complete 14 system review of symptoms, the patient complains only of the following symptoms, and all other reviewed systems are negative.  FSS 19 ESS 7  ALLERGIES: No Known Allergies  HOME MEDICATIONS: Outpatient Medications Prior to Visit  Medication Sig Dispense Refill   atorvastatin (LIPITOR) 20 MG tablet Take 20 mg by mouth at bedtime.     Cholecalciferol (VITAMIN D3 GUMMIES PO) Take 2 capsules by mouth daily.     fluticasone (FLONASE) 50 MCG/ACT nasal spray Place 1 spray into both nostrils daily as needed for allergies.     Halobetasol Propionate (LEXETTE) 0.05 % FOAM Apply 1 application topically daily as needed (rash).     ibuprofen (ADVIL) 200 MG tablet Take 400 mg by mouth every 6 (six) hours as needed for headache or moderate pain.     loratadine (CLARITIN) 10 MG tablet Take 10 mg by mouth daily.     Multiple Vitamin (MULTIVITAMIN WITH MINERALS) TABS tablet Take 3 tablets by mouth daily.     Omega-3 Fatty Acids (FISH OIL) 1200 MG CAPS Take 1,200 mg by mouth 2 (two) times daily.     PAXLOVID, 300/100, 20 x 150 MG & 10 x 100MG  TBPK Take 1-2 tablets by mouth See admin instructions. TAKE 2 NIRMATRELVIR TABLETS AND 1 RITONAVIR TABLET TOGETHER BY MOUTH TWICE DAILY FOR 5 DAYS     valACYclovir (VALTREX) 1000 MG tablet Take 1,000 mg by mouth 2 (two) times daily as needed (Acute cold sore).     venlafaxine (EFFEXOR) 100 MG tablet Take 100 mg by mouth daily.     omeprazole (PRILOSEC) 40 MG capsule Take 1 capsule (40 mg total) by mouth daily. 30 capsule 0   No  facility-administered medications prior to visit.    PAST MEDICAL HISTORY: Past Medical History:  Diagnosis Date   GERD (gastroesophageal reflux disease)    Hyperlipidemia    Hypertension     PAST SURGICAL HISTORY: Past Surgical History:  Procedure Laterality Date   ABDOMINAL HYSTERECTOMY     BIOPSY  09/04/2021   Procedure: BIOPSY;  Surgeon: 09/06/2021, MD;  Location: [facility];  Service: General;;   CESAREAN SECTION     ESOPHAGOGASTRODUODENOSCOPY N/A 09/04/2021   Procedure: ESOPHAGOGASTRODUODENOSCOPY (EGD);  Surgeon: 09/06/2021, MD;  [facility];  Service: General;  Laterality: N/A;    FAMILY HISTORY: Family History  Problem Relation Age of Onset   Sleep apnea Neg Hx     SOCIAL HISTORY: Social History   Socioeconomic History   Marital status: Married    Spouse name: Not on file   Number of children: Not on file   Years of education: Not on file   Highest education level: Not on file  Occupational History   Not on file  Tobacco Use   Smoking status: Never    Passive exposure: Never   Smokeless tobacco: Never  Vaping Use   Vaping Use: Never used  Substance and Sexual Activity   Alcohol use: Yes    Comment:  OCC   Drug use: Never   Sexual activity: Yes    Birth control/protection: Other-see comments  Other Topics Concern   Not on file  Social History Narrative   Not on file   Social Determinants of Health   Financial Resource Strain: Not on file  Food Insecurity: Not on file  Transportation Needs: Not on file  Physical Activity: Not on file  Stress: Not on file  Social Connections: Not on file  Intimate Partner Violence: Not on file      PHYSICAL EXAM  Vitals:   10/31/21 0916  BP: (!) 153/99  Pulse: 72  Weight: 189 lb 6.4 oz (85.9 kg)  Height: 5' (1.524 m)   Body mass index is 36.99 kg/m.  Generalized: Well developed, in no acute distress  Chest: Lungs clear to auscultation bilaterally  Neurological  examination  Mentation: Alert oriented to time, place, history taking. Follows all commands speech and language fluent Cranial nerve II-XII: Extraocular movements were full, visual field were full on confrontational test Head turning and shoulder shrug  were normal and symmetric. Motor: The motor testing reveals 5 over 5 strength of all 4 extremities. Good symmetric motor tone is noted throughout.  Sensory: Sensory testing is intact to soft touch on all 4 extremities. No evidence of extinction is noted.  Gait and station: Gait is normal.    DIAGNOSTIC DATA (LABS, IMAGING, TESTING) - I reviewed patient records, labs, notes, testing and imaging myself where available.     ASSESSMENT AND PLAN 55 y.o. year old female  has a past medical history of GERD (gastroesophageal reflux disease), Hyperlipidemia, and Hypertension. here with:  OSA on CPAP  - CPAP compliance excellent - Good treatment of AHI  - Encourage patient to use CPAP nightly and > 4 hours each night - F/U in 1 year or sooner if needed   Butch Penny, MSN, NP-C 10/31/2021, 10:39 AM [facility] [address] [place], [place] [address] [phone]

## 2021-10-31 NOTE — Patient Instructions (Signed)
Continue using CPAP nightly and greater than 4 hours each night °If your symptoms worsen or you develop new symptoms please let us know.  ° °

## 2021-11-02 NOTE — Patient Instructions (Addendum)
DUE TO COVID-19 ONLY ONE VISITOR IS ALLOWED TO COME WITH YOU AND STAY IN THE WAITING ROOM ONLY DURING PRE OP AND PROCEDURE.   **NO VISITORS ARE ALLOWED IN THE SHORT STAY AREA OR RECOVERY ROOM!!**  IF YOU WILL BE ADMITTED INTO THE HOSPITAL YOU ARE ALLOWED ONLY TWO SUPPORT PEOPLE DURING VISITATION HOURS ONLY (7 AM -8PM)   The support person(s) must pass our screening, gel in and out, and wear a mask at all times, including in the patients room. Patients must also wear a mask when staff or their support person are in the room. Visitors GUEST BADGE MUST BE WORN VISIBLY  One adult visitor may remain with you overnight and MUST be in the room by 8 P.M.  No visitors under the age of 62. Any visitor under the age of 29 must be accompanied by an adult.    Must self quarantine after testing. Follow instructions on handout.)       Your procedure is scheduled on: 11/26/21   Report to [facility] Main Entrance    Report to admitting at: 8:15 AM   Call this number if you have problems the morning of surgery [phone]   Do not eat food :After Midnight.   May have liquids until : 7:30 AM   day of surgery  CLEAR LIQUID DIET  Foods Allowed                                                                     Foods Excluded  Water, Black Coffee and tea, regular and decaf                             liquids that you cannot  Plain Jell-O in any flavor  (No red)                                           see through such as: Fruit ices (not with fruit pulp)                                     milk, soups, orange juice              Iced Popsicles (No red)                                    All solid food                                   Apple juices Sports drinks like Gatorade (No red) Lightly seasoned clear broth or consume(fat free) Sugar  Sample Menu Breakfast                                Lunch  Supper Cranberry juice                    Beef broth                             Chicken broth Jell-O                                     Grape juice                           Apple juice Coffee or tea                        Jell-O                                      Popsicle                                                Coffee or tea                        Coffee or tea      NO SOLID FOOD AFTER 600 PM THE NIGHT BEFORE YOUR SURGERY. YOU MAY DRINK CLEAR FLUIDS UNTIL: 7:30 AM  PAIN IS EXPECTED AFTER SURGERY AND WILL NOT BE COMPLETELY ELIMINATED. AMBULATION AND TYLENOL WILL HELP REDUCE INCISIONAL AND GAS PAIN. MOVEMENT IS KEY!  YOU ARE EXPECTED TO BE OUT OF BED WITHIN 4 HOURS OF ADMISSION TO YOUR PATIENT ROOM.  SITTING IN THE RECLINER THROUGHOUT THE DAY IS IMPORTANT FOR DRINKING FLUIDS AND MOVING GAS THROUGHOUT THE GI TRACT.  COMPRESSION STOCKINGS SHOULD BE WORN [facility] STAY UNLESS YOU ARE WALKING.   INCENTIVE SPIROMETER SHOULD BE USED EVERY HOUR WHILE AWAKE TO DECREASE POST-OPERATIVE COMPLICATIONS SUCH AS PNEUMONIA.  WHEN DISCHARGED HOME, IT IS IMPORTANT TO CONTINUE TO WALK EVERY HOUR AND USE THE INCENTIVE SPIROMETER EVERY HOUR.   PLEASE BRING CPAP MASK AND  TUBING ONLY. DEVICE WILL BE PROVIDED!   Oral Hygiene is also important to reduce your risk of infection.                                    Remember - BRUSH YOUR TEETH THE MORNING OF SURGERY WITH YOUR REGULAR TOOTHPASTE   Do NOT smoke after Midnight   Take these medicines the morning of surgery with A SIP OF WATER:Effexor,loratadine,omeprazole.Use Flonase as usual.  DO NOT TAKE ANY ORAL DIABETIC MEDICATIONS DAY OF YOUR SURGERY                              You may not have any metal on your body including hair pins, jewelry, and body piercing             Do not wear make-up, lotions, powders, perfumes/cologne, or deodorant  Do not wear nail polish including gel and S&S, artificial/acrylic nails, or any other type of covering on natural nails including  finger and  toenails. If you have artificial nails, gel coating, etc. that needs to be removed by a nail salon please have this removed prior to surgery or surgery may need to be canceled/ delayed if the surgeon/ anesthesia feels like they are unable to be safely monitored.   Do not shave  48 hours prior to surgery.    Do not bring valuables to the hospital. [facility] IS NOT             RESPONSIBLE   FOR VALUABLES.   Contacts, dentures or bridgework may not be worn into surgery.   Bring small overnight bag day of surgery.    Patients discharged on the day of surgery will not be allowed to drive home.   Special Instructions: Bring a copy of your healthcare power of attorney and living will documents         the day of surgery if you haven't scanned them before.              Please read over the following fact sheets you were given: IF YOU HAVE QUESTIONS ABOUT YOUR PRE-OP INSTRUCTIONS PLEASE CALL [phone]     [facility] - Preparing for Surgery Before surgery, you can play an important role.  Because skin is not sterile, your skin needs to be as free of germs as possible.  You can reduce the number of germs on your skin by washing with CHG (chlorahexidine gluconate) soap before surgery.  CHG is an antiseptic cleaner which kills germs and bonds with the skin to continue killing germs even after washing. Please DO NOT use if you have an allergy to CHG or antibacterial soaps.  If your skin becomes reddened/irritated stop using the CHG and inform your nurse when you arrive at Short Stay. Do not shave (including legs and underarms) for at least 48 hours prior to the first CHG shower.  You may shave your face/neck. Please follow these instructions carefully:  1.  Shower with CHG Soap the night before surgery and the  morning of Surgery.  2.  If you choose to wash your hair, wash your hair first as usual with your  normal  shampoo.  3.  After you shampoo, rinse your hair and body thoroughly to remove the   shampoo.                           4.  Use CHG as you would any other liquid soap.  You can apply chg directly  to the skin and wash                       Gently with a scrungie or clean washcloth.  5.  Apply the CHG Soap to your body ONLY FROM THE NECK DOWN.   Do not use on face/ open                           Wound or open sores. Avoid contact with eyes, ears mouth and genitals (private parts).                       Wash face,  Genitals (private parts) with your normal soap.             6.  Wash thoroughly, paying special attention to the area where your surgery  will be performed.  7.  Thoroughly  rinse your body with warm water from the neck down.  8.  DO NOT shower/wash with your normal soap after using and rinsing off  the CHG Soap.                9.  Pat yourself dry with a clean towel.            10.  Wear clean pajamas.            11.  Place clean sheets on your bed the night of your first shower and do not  sleep with pets. Day of Surgery : Do not apply any lotions/deodorants the morning of surgery.  Please wear clean clothes to the hospital/surgery center.  FAILURE TO FOLLOW THESE INSTRUCTIONS MAY RESULT IN THE CANCELLATION OF YOUR SURGERY PATIENT SIGNATURE_________________________________  NURSE SIGNATURE__________________________________  ________________________________________________________________________

## 2021-11-02 NOTE — Progress Notes (Signed)
Pt. Needs orders for surgery. 

## 2021-11-06 ENCOUNTER — Encounter (HOSPITAL_COMMUNITY)
Admission: RE | Admit: 2021-11-06 | Discharge: 2021-11-06 | Disposition: A | Discharge status: Home / Self Care | Payer: BC Managed Care – PPO | Source: Ambulatory Visit | Attending: Surgery | Admitting: Surgery

## 2021-11-06 ENCOUNTER — Other Ambulatory Visit: Payer: Self-pay

## 2021-11-06 ENCOUNTER — Encounter (HOSPITAL_COMMUNITY): Payer: Self-pay

## 2021-11-06 VITALS — BP 148/99 | HR 86 | Temp 98.1°F | Resp 18 | Ht 60.0 in | Wt 191.2 lb

## 2021-11-06 DIAGNOSIS — Z01812 Encounter for preprocedural laboratory examination: Secondary | ICD-10-CM | POA: Insufficient documentation

## 2021-11-06 DIAGNOSIS — I2510 Atherosclerotic heart disease of native coronary artery without angina pectoris: Secondary | ICD-10-CM | POA: Diagnosis not present

## 2021-11-06 DIAGNOSIS — Z01818 Encounter for other preprocedural examination: Secondary | ICD-10-CM

## 2021-11-06 HISTORY — DX: Depression, unspecified: F32.A

## 2021-11-06 HISTORY — DX: Anxiety disorder, unspecified: F41.9

## 2021-11-06 LAB — CBC
HCT: 38.4 % (ref 36.0–46.0)
Hemoglobin: 12 g/dL (ref 12.0–15.0)
MCH: 26.7 pg (ref 26.0–34.0)
MCHC: 31.3 g/dL (ref 30.0–36.0)
MCV: 85.5 fL (ref 80.0–100.0)
Platelets: 382 10*3/uL (ref 150–400)
RBC: 4.49 MIL/uL (ref 3.87–5.11)
RDW: 14.3 % (ref 11.5–15.5)
WBC: 7.8 10*3/uL (ref 4.0–10.5)
nRBC: 0 % (ref 0.0–0.2)

## 2021-11-06 LAB — BASIC METABOLIC PANEL
Anion gap: 8 (ref 5–15)
BUN: 21 mg/dL — ABNORMAL HIGH (ref 6–20)
CO2: 28 mmol/L (ref 22–32)
Calcium: 9.2 mg/dL (ref 8.9–10.3)
Chloride: 104 mmol/L (ref 98–111)
Creatinine, Ser: 0.71 mg/dL (ref 0.44–1.00)
GFR, Estimated: >60 mL/min (ref >60)
Glucose, Bld: 110 mg/dL — ABNORMAL HIGH (ref 70–99)
Potassium: 4.1 mmol/L (ref 3.5–5.1)
Sodium: 140 mmol/L (ref 135–145)

## 2021-11-06 NOTE — Progress Notes (Signed)
COVID Vaccine Completed: Yes Date COVID Vaccine completed: 09/2020 x 3 COVID vaccine manufacturer: Pfizer   COVID Test: N/A. Pt. Was COVID + on: 10/27/21. Results in the chart. PCP - [facility]  Cardiologist -   Chest x-ray - 09/04/21 EKG - 09/04/21 Stress Test -  ECHO -  Cardiac Cath -  Pacemaker/ICD device last checked:  Sleep Study - Yes CPAP - Yes  Fasting Blood Sugar -  Checks Blood Sugar _____ times a day  Blood Thinner Instructions: Aspirin Instructions: Last Dose:  Anesthesia review: Hx: HTN,OSA(CPAP)  Patient denies shortness of breath, fever, cough and chest pain at PAT appointment   Patient verbalized understanding of instructions that were given to them at the PAT appointment. Patient was also instructed that they will need to review over the PAT instructions again at home before surgery.

## 2021-11-22 ENCOUNTER — Other Ambulatory Visit (HOSPITAL_COMMUNITY): Payer: BC Managed Care – PPO

## 2021-11-26 ENCOUNTER — Other Ambulatory Visit: Payer: Self-pay

## 2021-11-26 ENCOUNTER — Inpatient Hospital Stay (HOSPITAL_COMMUNITY): Payer: BC Managed Care – PPO | Admitting: Anesthesiology

## 2021-11-26 ENCOUNTER — Encounter (HOSPITAL_COMMUNITY)
Admission: RE | Disposition: A | Discharge status: Home / Self Care | Payer: Self-pay | Source: Home / Self Care | Attending: Surgery

## 2021-11-26 ENCOUNTER — Inpatient Hospital Stay (HOSPITAL_COMMUNITY)
Admission: RE | Admit: 2021-11-26 | Discharge: 2021-11-27 | DRG: 621 | Disposition: A | Discharge status: Home / Self Care | Payer: BC Managed Care – PPO | Attending: Surgery | Admitting: Surgery

## 2021-11-26 ENCOUNTER — Inpatient Hospital Stay (HOSPITAL_COMMUNITY): Payer: BC Managed Care – PPO | Admitting: Physician Assistant

## 2021-11-26 ENCOUNTER — Encounter (HOSPITAL_COMMUNITY): Payer: Self-pay | Admitting: Surgery

## 2021-11-26 DIAGNOSIS — E785 Hyperlipidemia, unspecified: Secondary | ICD-10-CM | POA: Diagnosis present

## 2021-11-26 DIAGNOSIS — E669 Obesity, unspecified: Secondary | ICD-10-CM | POA: Diagnosis present

## 2021-11-26 DIAGNOSIS — Z681 Body mass index (BMI) 19 or less, adult: Secondary | ICD-10-CM

## 2021-11-26 DIAGNOSIS — K219 Gastro-esophageal reflux disease without esophagitis: Secondary | ICD-10-CM | POA: Diagnosis present

## 2021-11-26 DIAGNOSIS — K449 Diaphragmatic hernia without obstruction or gangrene: Secondary | ICD-10-CM | POA: Diagnosis present

## 2021-11-26 DIAGNOSIS — Z90710 Acquired absence of both cervix and uterus: Secondary | ICD-10-CM | POA: Diagnosis not present

## 2021-11-26 DIAGNOSIS — F419 Anxiety disorder, unspecified: Secondary | ICD-10-CM | POA: Diagnosis present

## 2021-11-26 DIAGNOSIS — Z79899 Other long term (current) drug therapy: Secondary | ICD-10-CM

## 2021-11-26 DIAGNOSIS — F32A Depression, unspecified: Secondary | ICD-10-CM | POA: Diagnosis present

## 2021-11-26 DIAGNOSIS — M1990 Unspecified osteoarthritis, unspecified site: Secondary | ICD-10-CM | POA: Diagnosis present

## 2021-11-26 DIAGNOSIS — I10 Essential (primary) hypertension: Secondary | ICD-10-CM | POA: Diagnosis present

## 2021-11-26 DIAGNOSIS — Z6837 Body mass index (BMI) 37.0-37.9, adult: Secondary | ICD-10-CM

## 2021-11-26 DIAGNOSIS — Z01818 Encounter for other preprocedural examination: Secondary | ICD-10-CM

## 2021-11-26 HISTORY — PX: HIATAL HERNIA REPAIR: SHX195

## 2021-11-26 LAB — COMPREHENSIVE METABOLIC PANEL
ALT: 29 U/L (ref 0–44)
AST: 49 U/L — ABNORMAL HIGH (ref 15–41)
Albumin: 3.9 g/dL (ref 3.5–5.0)
Alkaline Phosphatase: 68 U/L (ref 38–126)
Anion gap: 8 (ref 5–15)
BUN: 16 mg/dL (ref 6–20)
CO2: 25 mmol/L (ref 22–32)
Calcium: 8.4 mg/dL — ABNORMAL LOW (ref 8.9–10.3)
Chloride: 104 mmol/L (ref 98–111)
Creatinine, Ser: 1.04 mg/dL — ABNORMAL HIGH (ref 0.44–1.00)
GFR, Estimated: >60 mL/min (ref >60)
Glucose, Bld: 206 mg/dL — ABNORMAL HIGH (ref 70–99)
Potassium: 3.7 mmol/L (ref 3.5–5.1)
Sodium: 137 mmol/L (ref 135–145)
Total Bilirubin: 0.7 mg/dL (ref 0.3–1.2)
Total Protein: 7.1 g/dL (ref 6.5–8.1)

## 2021-11-26 LAB — CBC WITH DIFFERENTIAL/PLATELET
Abs Immature Granulocytes: 0.11 10*3/uL — ABNORMAL HIGH (ref 0.00–0.07)
Basophils Absolute: 0.1 10*3/uL (ref 0.0–0.1)
Basophils Relative: 0 %
Eosinophils Absolute: 0 10*3/uL (ref 0.0–0.5)
Eosinophils Relative: 0 %
HCT: 38.2 % (ref 36.0–46.0)
Hemoglobin: 12 g/dL (ref 12.0–15.0)
Immature Granulocytes: 1 %
Lymphocytes Relative: 11 %
Lymphs Abs: 1.8 10*3/uL (ref 0.7–4.0)
MCH: 27.1 pg (ref 26.0–34.0)
MCHC: 31.4 g/dL (ref 30.0–36.0)
MCV: 86.2 fL (ref 80.0–100.0)
Monocytes Absolute: 0.3 10*3/uL (ref 0.1–1.0)
Monocytes Relative: 2 %
Neutro Abs: 13.8 10*3/uL — ABNORMAL HIGH (ref 1.7–7.7)
Neutrophils Relative %: 86 %
Platelets: 398 10*3/uL (ref 150–400)
RBC: 4.43 MIL/uL (ref 3.87–5.11)
RDW: 15.4 % (ref 11.5–15.5)
WBC: 16.1 10*3/uL — ABNORMAL HIGH (ref 4.0–10.5)
nRBC: 0 % (ref 0.0–0.2)

## 2021-11-26 LAB — HEMOGLOBIN AND HEMATOCRIT, BLOOD
HCT: 36.2 % (ref 36.0–46.0)
Hemoglobin: 11.6 g/dL — ABNORMAL LOW (ref 12.0–15.0)

## 2021-11-26 LAB — TYPE AND SCREEN
ABO/RH(D): O POS
Antibody Screen: NEGATIVE

## 2021-11-26 LAB — ABO/RH: ABO/RH(D): O POS

## 2021-11-26 SURGERY — CREATION, GASTRIC BYPASS, ROUX-EN-Y, ROBOT-ASSISTED
Anesthesia: General | Site: Abdomen

## 2021-11-26 MED ORDER — CHLORHEXIDINE GLUCONATE 0.12 % MT SOLN
15.0000 mL | Freq: Once | OROMUCOSAL | Status: AC
  Administered 2021-11-26: 15 mL via OROMUCOSAL

## 2021-11-26 MED ORDER — LIDOCAINE HCL (PF) 2 % IJ SOLN
INTRAMUSCULAR | Status: AC
  Filled 2021-11-26: qty 5

## 2021-11-26 MED ORDER — ONDANSETRON HCL 4 MG/2ML IJ SOLN
INTRAMUSCULAR | Status: DC | PRN
  Administered 2021-11-26: 4 mg via INTRAVENOUS

## 2021-11-26 MED ORDER — SODIUM CHLORIDE 0.9 % IV SOLN
2.0000 g | INTRAVENOUS | Status: AC
  Administered 2021-11-26: 2 g via INTRAVENOUS
  Filled 2021-11-26: qty 2

## 2021-11-26 MED ORDER — VALACYCLOVIR HCL 500 MG PO TABS: 1000.0000 mg | ORAL_TABLET | Freq: Two times a day (BID) | ORAL | Status: DC | PRN

## 2021-11-26 MED ORDER — ONDANSETRON HCL 4 MG/2ML IJ SOLN
INTRAMUSCULAR | Status: AC
  Filled 2021-11-26: qty 2

## 2021-11-26 MED ORDER — CHLORHEXIDINE GLUCONATE CLOTH 2 % EX PADS: 6.0000 | MEDICATED_PAD | Freq: Once | CUTANEOUS | Status: DC

## 2021-11-26 MED ORDER — HYDROMORPHONE HCL 1 MG/ML IJ SOLN
0.2500 mg | INTRAMUSCULAR | Status: DC | PRN
  Administered 2021-11-26 (×4): 0.5 mg via INTRAVENOUS

## 2021-11-26 MED ORDER — ROCURONIUM BROMIDE 10 MG/ML (PF) SYRINGE
PREFILLED_SYRINGE | INTRAVENOUS | Status: DC | PRN
Start: 2021-11-26 — End: 2021-11-26
  Administered 2021-11-26 (×2): 10 mg via INTRAVENOUS
  Administered 2021-11-26: 70 mg via INTRAVENOUS
  Administered 2021-11-26: 20 mg via INTRAVENOUS

## 2021-11-26 MED ORDER — FENTANYL CITRATE (PF) 250 MCG/5ML IJ SOLN
INTRAMUSCULAR | Status: AC
  Filled 2021-11-26: qty 5

## 2021-11-26 MED ORDER — BUPIVACAINE-EPINEPHRINE (PF) 0.25% -1:200000 IJ SOLN
INTRAMUSCULAR | Status: AC
  Filled 2021-11-26: qty 30

## 2021-11-26 MED ORDER — PHENYLEPHRINE HCL (PRESSORS) 10 MG/ML IV SOLN
INTRAVENOUS | Status: AC
  Filled 2021-11-26: qty 1

## 2021-11-26 MED ORDER — PHENYLEPHRINE 40 MCG/ML (10ML) SYRINGE FOR IV PUSH (FOR BLOOD PRESSURE SUPPORT)
PREFILLED_SYRINGE | INTRAVENOUS | Status: AC
  Filled 2021-11-26: qty 10

## 2021-11-26 MED ORDER — BUPIVACAINE LIPOSOME 1.3 % IJ SUSP
INTRAMUSCULAR | Status: DC | PRN
  Administered 2021-11-26: 20 mL

## 2021-11-26 MED ORDER — AMISULPRIDE (ANTIEMETIC) 5 MG/2ML IV SOLN
INTRAVENOUS | Status: AC
  Filled 2021-11-26: qty 4

## 2021-11-26 MED ORDER — HYDROMORPHONE HCL 1 MG/ML IJ SOLN
INTRAMUSCULAR | Status: AC
  Filled 2021-11-26: qty 2

## 2021-11-26 MED ORDER — LACTATED RINGERS IV SOLN: INTRAVENOUS | Status: DC

## 2021-11-26 MED ORDER — SUGAMMADEX SODIUM 200 MG/2ML IV SOLN
INTRAVENOUS | Status: DC | PRN
  Administered 2021-11-26: 200 mg via INTRAVENOUS

## 2021-11-26 MED ORDER — 0.9 % SODIUM CHLORIDE (POUR BTL) OPTIME
TOPICAL | Status: DC | PRN
  Administered 2021-11-26: 1000 mL

## 2021-11-26 MED ORDER — ONDANSETRON HCL 4 MG/2ML IJ SOLN
4.0000 mg | INTRAMUSCULAR | Status: DC | PRN
  Administered 2021-11-27: 4 mg via INTRAVENOUS
  Filled 2021-11-26: qty 2

## 2021-11-26 MED ORDER — BUPIVACAINE LIPOSOME 1.3 % IJ SUSP: 20.0000 mL | Freq: Once | INTRAMUSCULAR | Status: DC

## 2021-11-26 MED ORDER — ORAL CARE MOUTH RINSE: 15.0000 mL | Freq: Once | OROMUCOSAL | Status: AC

## 2021-11-26 MED ORDER — AMISULPRIDE (ANTIEMETIC) 5 MG/2ML IV SOLN
10.0000 mg | Freq: Once | INTRAVENOUS | Status: AC | PRN
  Administered 2021-11-26: 10 mg via INTRAVENOUS

## 2021-11-26 MED ORDER — KETAMINE HCL 10 MG/ML IJ SOLN
INTRAMUSCULAR | Status: DC | PRN
  Administered 2021-11-26: 25 mg via INTRAVENOUS

## 2021-11-26 MED ORDER — EPHEDRINE 5 MG/ML INJ
INTRAVENOUS | Status: AC
  Filled 2021-11-26: qty 5

## 2021-11-26 MED ORDER — MIDAZOLAM HCL 2 MG/2ML IJ SOLN
INTRAMUSCULAR | Status: DC | PRN
  Administered 2021-11-26: 2 mg via INTRAVENOUS

## 2021-11-26 MED ORDER — APREPITANT 40 MG PO CAPS
40.0000 mg | ORAL_CAPSULE | ORAL | Status: AC
  Administered 2021-11-26: 40 mg via ORAL
  Filled 2021-11-26: qty 1

## 2021-11-26 MED ORDER — LACTATED RINGERS IR SOLN
Status: DC | PRN
  Administered 2021-11-26: 1000 mL

## 2021-11-26 MED ORDER — PANTOPRAZOLE SODIUM 40 MG IV SOLR
40.0000 mg | Freq: Every day | INTRAVENOUS | Status: DC
  Administered 2021-11-26: 40 mg via INTRAVENOUS
  Filled 2021-11-26: qty 40

## 2021-11-26 MED ORDER — MIDAZOLAM HCL 2 MG/2ML IJ SOLN
INTRAMUSCULAR | Status: AC
  Filled 2021-11-26: qty 2

## 2021-11-26 MED ORDER — CELECOXIB 200 MG PO CAPS
400.0000 mg | ORAL_CAPSULE | ORAL | Status: AC
  Administered 2021-11-26: 400 mg via ORAL
  Filled 2021-11-26: qty 2

## 2021-11-26 MED ORDER — LIDOCAINE 20MG/ML (2%) 15 ML SYRINGE OPTIME
INTRAMUSCULAR | Status: DC | PRN
  Administered 2021-11-26: 1.5 mg/kg/h via INTRAVENOUS

## 2021-11-26 MED ORDER — KETAMINE HCL 50 MG/5ML IJ SOSY
PREFILLED_SYRINGE | INTRAMUSCULAR | Status: AC
  Filled 2021-11-26: qty 5

## 2021-11-26 MED ORDER — ACETAMINOPHEN 500 MG PO TABS
1000.0000 mg | ORAL_TABLET | ORAL | Status: AC
  Administered 2021-11-26: 1000 mg via ORAL
  Filled 2021-11-26: qty 2

## 2021-11-26 MED ORDER — ACETAMINOPHEN 160 MG/5ML PO SOLN
1000.0000 mg | Freq: Three times a day (TID) | ORAL | Status: DC
  Filled 2021-11-26: qty 40.6

## 2021-11-26 MED ORDER — PROPOFOL 10 MG/ML IV BOLUS
INTRAVENOUS | Status: AC
  Filled 2021-11-26: qty 20

## 2021-11-26 MED ORDER — ATORVASTATIN CALCIUM 20 MG PO TABS
20.0000 mg | ORAL_TABLET | Freq: Every day | ORAL | Status: DC
Start: 2021-11-26 — End: 2021-11-27
  Administered 2021-11-26: 20 mg via ORAL
  Filled 2021-11-26: qty 1

## 2021-11-26 MED ORDER — LORATADINE 10 MG PO TABS
10.0000 mg | ORAL_TABLET | Freq: Every day | ORAL | Status: DC
  Administered 2021-11-27: 10 mg via ORAL
  Filled 2021-11-26: qty 1

## 2021-11-26 MED ORDER — VALACYCLOVIR HCL 500 MG PO TABS
500.0000 mg | ORAL_TABLET | Freq: Two times a day (BID) | ORAL | Status: DC | PRN
  Administered 2021-11-26 – 2021-11-27 (×3): 500 mg via ORAL
  Filled 2021-11-26 (×5): qty 1

## 2021-11-26 MED ORDER — ENOXAPARIN SODIUM 30 MG/0.3ML IJ SOSY
30.0000 mg | PREFILLED_SYRINGE | Freq: Two times a day (BID) | INTRAMUSCULAR | Status: DC
  Administered 2021-11-26 – 2021-11-27 (×2): 30 mg via SUBCUTANEOUS
  Filled 2021-11-26 (×2): qty 0.3

## 2021-11-26 MED ORDER — ROCURONIUM BROMIDE 10 MG/ML (PF) SYRINGE
PREFILLED_SYRINGE | INTRAVENOUS | Status: AC
  Filled 2021-11-26: qty 10

## 2021-11-26 MED ORDER — PHENYLEPHRINE 40 MCG/ML (10ML) SYRINGE FOR IV PUSH (FOR BLOOD PRESSURE SUPPORT)
PREFILLED_SYRINGE | INTRAVENOUS | Status: DC | PRN
  Administered 2021-11-26 (×4): 80 ug via INTRAVENOUS

## 2021-11-26 MED ORDER — BUPIVACAINE-EPINEPHRINE 0.25% -1:200000 IJ SOLN
INTRAMUSCULAR | Status: DC | PRN
  Administered 2021-11-26: 30 mL

## 2021-11-26 MED ORDER — ENOXAPARIN SODIUM 40 MG/0.4ML IJ SOSY
40.0000 mg | PREFILLED_SYRINGE | INTRAMUSCULAR | Status: AC
  Administered 2021-11-26: 40 mg via SUBCUTANEOUS
  Filled 2021-11-26: qty 0.4

## 2021-11-26 MED ORDER — DEXAMETHASONE SODIUM PHOSPHATE 10 MG/ML IJ SOLN
INTRAMUSCULAR | Status: DC | PRN
  Administered 2021-11-26: 5 mg via INTRAVENOUS

## 2021-11-26 MED ORDER — LIDOCAINE HCL 2 % IJ SOLN
INTRAMUSCULAR | Status: AC
  Filled 2021-11-26: qty 20

## 2021-11-26 MED ORDER — FENTANYL CITRATE (PF) 250 MCG/5ML IJ SOLN
INTRAMUSCULAR | Status: DC | PRN
  Administered 2021-11-26: 100 ug via INTRAVENOUS
  Administered 2021-11-26 (×3): 50 ug via INTRAVENOUS

## 2021-11-26 MED ORDER — PROPOFOL 10 MG/ML IV BOLUS
INTRAVENOUS | Status: DC | PRN
Start: 2021-11-26 — End: 2021-11-26
  Administered 2021-11-26: 160 mg via INTRAVENOUS

## 2021-11-26 MED ORDER — MORPHINE SULFATE (PF) 2 MG/ML IV SOLN
1.0000 mg | INTRAVENOUS | Status: DC | PRN
  Administered 2021-11-26 – 2021-11-27 (×2): 2 mg via INTRAVENOUS
  Filled 2021-11-26 (×2): qty 1

## 2021-11-26 MED ORDER — ACETAMINOPHEN 500 MG PO TABS
1000.0000 mg | ORAL_TABLET | Freq: Three times a day (TID) | ORAL | Status: DC
  Administered 2021-11-26 – 2021-11-27 (×4): 1000 mg via ORAL
  Filled 2021-11-26 (×4): qty 2

## 2021-11-26 MED ORDER — SCOPOLAMINE 1 MG/3DAYS TD PT72
1.0000 | MEDICATED_PATCH | Freq: Once | TRANSDERMAL | Status: DC
  Administered 2021-11-26: 1.5 mg via TRANSDERMAL
  Filled 2021-11-26: qty 1

## 2021-11-26 MED ORDER — FLUTICASONE PROPIONATE 50 MCG/ACT NA SUSP
1.0000 | Freq: Every day | NASAL | Status: DC | PRN
  Filled 2021-11-26: qty 16

## 2021-11-26 MED ORDER — ENSURE MAX PROTEIN PO LIQD
2.0000 [oz_av] | ORAL | Status: DC
  Administered 2021-11-27 (×3): 2 [oz_av] via ORAL

## 2021-11-26 MED ORDER — LIDOCAINE 2% (20 MG/ML) 5 ML SYRINGE
INTRAMUSCULAR | Status: DC | PRN
  Administered 2021-11-26: 60 mg via INTRAVENOUS

## 2021-11-26 MED ORDER — VENLAFAXINE HCL 50 MG PO TABS: 100.0000 mg | ORAL_TABLET | Freq: Every day | ORAL | Status: DC

## 2021-11-26 MED ORDER — OXYCODONE HCL 5 MG/5ML PO SOLN
5.0000 mg | Freq: Four times a day (QID) | ORAL | Status: DC | PRN
  Administered 2021-11-26 – 2021-11-27 (×4): 5 mg via ORAL
  Filled 2021-11-26 (×4): qty 5

## 2021-11-26 MED ORDER — PHENYLEPHRINE HCL-NACL 20-0.9 MG/250ML-% IV SOLN
INTRAVENOUS | Status: DC | PRN
  Administered 2021-11-26: 25 ug/min via INTRAVENOUS

## 2021-11-26 MED ORDER — BUPIVACAINE LIPOSOME 1.3 % IJ SUSP
INTRAMUSCULAR | Status: AC
  Filled 2021-11-26: qty 20

## 2021-11-26 MED ORDER — DEXAMETHASONE SODIUM PHOSPHATE 10 MG/ML IJ SOLN
INTRAMUSCULAR | Status: AC
  Filled 2021-11-26: qty 1

## 2021-11-26 MED ORDER — VENLAFAXINE HCL 50 MG PO TABS
100.0000 mg | ORAL_TABLET | Freq: Every day | ORAL | Status: DC
  Administered 2021-11-27: 100 mg via ORAL
  Filled 2021-11-26: qty 2

## 2021-11-26 MED ORDER — EPHEDRINE SULFATE-NACL 50-0.9 MG/10ML-% IV SOSY
PREFILLED_SYRINGE | INTRAVENOUS | Status: DC | PRN
  Administered 2021-11-26 (×3): 5 mg via INTRAVENOUS

## 2021-11-26 MED ORDER — STERILE WATER FOR IRRIGATION IR SOLN
Status: DC | PRN
  Administered 2021-11-26: 1000 mL

## 2021-11-26 MED ORDER — LIP MEDEX EX OINT
TOPICAL_OINTMENT | CUTANEOUS | Status: AC
  Filled 2021-11-26: qty 7

## 2021-11-26 SURGICAL SUPPLY — 73 items
APPLIER CLIP 5 13 M/L LIGAMAX5 (MISCELLANEOUS)
APPLIER CLIP ROT 10 11.4 M/L (STAPLE)
BLADE SURG SZ11 CARB STEEL (BLADE) ×2 IMPLANT
CANNULA REDUC XI 12-8 STAPL (CANNULA) ×1
CANNULA REDUCER 12-8 DVNC XI (CANNULA) ×1 IMPLANT
CHLORAPREP W/TINT 26 (MISCELLANEOUS) ×2 IMPLANT
CLIP APPLIE 5 13 M/L LIGAMAX5 (MISCELLANEOUS) IMPLANT
CLIP APPLIE ROT 10 11.4 M/L (STAPLE) IMPLANT
COVER SURGICAL LIGHT HANDLE (MISCELLANEOUS) ×2 IMPLANT
COVER TIP SHEARS 8 DVNC (MISCELLANEOUS) ×1 IMPLANT
COVER TIP SHEARS 8MM DA VINCI (MISCELLANEOUS) ×1
DECANTER SPIKE VIAL GLASS SM (MISCELLANEOUS) ×2 IMPLANT
DERMABOND ADVANCED (GAUZE/BANDAGES/DRESSINGS) ×1
DERMABOND ADVANCED .7 DNX12 (GAUZE/BANDAGES/DRESSINGS) ×1 IMPLANT
DRAPE ARM DVNC X/XI (DISPOSABLE) ×4 IMPLANT
DRAPE COLUMN DVNC XI (DISPOSABLE) ×1 IMPLANT
DRAPE DA VINCI XI ARM (DISPOSABLE) ×4
DRAPE DA VINCI XI COLUMN (DISPOSABLE) ×1
ELECT REM PT RETURN 15FT ADLT (MISCELLANEOUS) ×2 IMPLANT
GAUZE 4X4 16PLY ~~LOC~~+RFID DBL (SPONGE) ×2 IMPLANT
GLOVE SURG ENC MOIS LTX SZ7.5 (GLOVE) ×4 IMPLANT
GLOVE SURG UNDER LTX SZ8 (GLOVE) ×4 IMPLANT
GOWN STRL REUS W/TWL XL LVL3 (GOWN DISPOSABLE) ×6 IMPLANT
GRASPER SUT TROCAR 14GX15 (MISCELLANEOUS) ×2 IMPLANT
IRRIG SUCT STRYKERFLOW 2 WTIP (MISCELLANEOUS) ×2
IRRIGATION SUCT STRKRFLW 2 WTP (MISCELLANEOUS) ×1 IMPLANT
KIT BASIN OR (CUSTOM PROCEDURE TRAY) ×2 IMPLANT
KIT GASTRIC LAVAGE 34FR ADT (SET/KITS/TRAYS/PACK) ×3 IMPLANT
KIT TURNOVER KIT A (KITS) ×1 IMPLANT
LUBRICANT JELLY K Y 4OZ (MISCELLANEOUS) IMPLANT
MARKER SKIN DUAL TIP RULER LAB (MISCELLANEOUS) ×2 IMPLANT
MAT PREVALON FULL STRYKER (MISCELLANEOUS) ×2 IMPLANT
NDL SPNL 18GX3.5 QUINCKE PK (NEEDLE) ×1 IMPLANT
NEEDLE SPNL 18GX3.5 QUINCKE PK (NEEDLE) ×2 IMPLANT
OBTURATOR OPTICAL STANDARD 8MM (TROCAR) ×1
OBTURATOR OPTICAL STND 8 DVNC (TROCAR) ×1
OBTURATOR OPTICALSTD 8 DVNC (TROCAR) ×1 IMPLANT
PACK CARDIOVASCULAR III (CUSTOM PROCEDURE TRAY) ×2 IMPLANT
RELOAD STAPLE 60 2.5 WHT DVNC (STAPLE) ×3 IMPLANT
RELOAD STAPLE 60 3.5 BLU DVNC (STAPLE) IMPLANT
RELOAD STAPLER 2.5X60 WHT DVNC (STAPLE) ×7 IMPLANT
RELOAD STAPLER 3.5X60 BLU DVNC (STAPLE) ×2 IMPLANT
SEAL CANN UNIV 5-8 DVNC XI (MISCELLANEOUS) ×3 IMPLANT
SEAL XI 5MM-8MM UNIVERSAL (MISCELLANEOUS) ×3
SEALER SYNCHRO 8 IS4000 DV (MISCELLANEOUS) ×1
SEALER SYNCHRO 8 IS4000 DVNC (MISCELLANEOUS) ×1 IMPLANT
SOL ANTI FOG 6CC (MISCELLANEOUS) ×1 IMPLANT
SOLUTION ANTI FOG 6CC (MISCELLANEOUS) ×1
SOLUTION ELECTROLUBE (MISCELLANEOUS) ×1 IMPLANT
STAPLER 60 DA VINCI SURE FORM (STAPLE) ×1
STAPLER 60 SUREFORM DVNC (STAPLE) ×1 IMPLANT
STAPLER CANNULA SEAL DVNC XI (STAPLE) ×1 IMPLANT
STAPLER CANNULA SEAL XI (STAPLE) ×1
STAPLER RELOAD 2.5X60 WHITE (STAPLE) ×7
STAPLER RELOAD 2.5X60 WHT DVNC (STAPLE) ×7
STAPLER RELOAD 3.5X60 BLU DVNC (STAPLE) ×2
STAPLER RELOAD 3.5X60 BLUE (STAPLE) ×2
SUT ETHIBOND 0 (SUTURE) ×2 IMPLANT
SUT MNCRL AB 4-0 PS2 18 (SUTURE) ×2 IMPLANT
SUT V-LOC BARB 180 2/0GR6 GS22 (SUTURE) ×4
SUT VIC AB 2-0 SH 27 (SUTURE) ×1
SUT VIC AB 2-0 SH 27XBRD (SUTURE) IMPLANT
SUT VICRYL 0 TIES 12 18 (SUTURE) ×2 IMPLANT
SUT VLOC BARB 180 ABS3/0GR12 (SUTURE) ×4
SUTURE V-LC BRB 180 2/0GR6GS22 (SUTURE) ×2 IMPLANT
SUTURE VLOC BRB 180 ABS3/0GR12 (SUTURE) ×2 IMPLANT
SYR 20ML LL LF (SYRINGE) ×2 IMPLANT
TOWEL OR 17X26 10 PK STRL BLUE (TOWEL DISPOSABLE) ×2 IMPLANT
TRAY FOLEY MTR SLVR 16FR STAT (SET/KITS/TRAYS/PACK) IMPLANT
TROCAR ADV FIXATION 12X100MM (TROCAR) ×2 IMPLANT
TROCAR Z-THREAD FIOS 5X100MM (TROCAR) ×2 IMPLANT
TUBE CALIBRATION LAPBAND (TUBING) IMPLANT
TUBING INSUFFLATION 10FT LAP (TUBING) ×2 IMPLANT

## 2021-11-26 NOTE — Anesthesia Procedure Notes (Signed)
Procedure Name: Intubation Date/Time: 11/26/2021 10:47 AM Performed by: Elyn Peers, CRNA Pre-anesthesia Checklist: Patient identified, Emergency Drugs available, Suction available, Patient being monitored and Timeout performed Patient Re-evaluated:Patient Re-evaluated prior to induction Oxygen Delivery Method: Circle system utilized Preoxygenation: Pre-oxygenation with 100% oxygen Induction Type: IV induction Ventilation: Mask ventilation without difficulty Laryngoscope Size: Miller and 3 Grade View: Grade I Tube type: Oral Tube size: 7.0 mm Number of attempts: 1 Airway Equipment and Method: Stylet Placement Confirmation: ETT inserted through vocal cords under direct vision, positive ETCO2 and breath sounds checked- equal and bilateral Secured at: 20 cm Tube secured with: Tape Dental Injury: Teeth and Oropharynx as per pre-operative assessment

## 2021-11-26 NOTE — Discharge Instructions (Addendum)
GASTRIC BYPASS / SLEEVE  °Home Care Instructions ° °These instructions are to help you care for yourself when you go home. ° °Call: If you have any problems. °Call [phone] and ask for the surgeon on call °If you have an emergency related to your surgery please use the ER at [facility].  °Tell the ER staff that you are a new post-op gastric bypass or gastric sleeve patient °  °Signs and symptoms to report: Severe vomiting or nausea °If you cannot handle clear liquids for longer than 1 day, call your surgeon  °Abdominal pain which does not get better after taking your pain medication °Fever greater than 100.4° F and chills °Heart rate over 100 beats a minute °Trouble breathing °Chest pain ° Redness, swelling, drainage, or foul odor at incision (surgical) sites ° If your incisions open or pull apart °Swelling or pain in calf (lower leg) °Diarrhea (Loose bowel movements that happen often), frequent watery, uncontrolled bowel movements °Constipation, (no bowel movements for 3 days) if this happens:  °Take Milk of Magnesia, 2 tablespoons by mouth, 3 times a day for 2 days if needed °Stop taking Milk of Magnesia once you have had a bowel movement °Call your doctor if constipation continues °Or °Take Miralax  (instead of Milk of Magnesia) following the label instructions °Stop taking Miralax once you have had a bowel movement °Call your doctor if constipation continues °Anything you think is “abnormal for you” °  °Normal side effects after surgery: Unable to sleep at night or unable to concentrate °Irritability °Being tearful (crying) or depressed °These are common complaints, possibly related to your anesthesia, stress of surgery and change in lifestyle, that usually go away a few weeks after surgery.  If these feelings continue, call your medical doctor.  °Wound Care: You may have surgical glue, steri-strips, or staples over your incisions after surgery °Surgical glue:  Looks like a clear film over your incisions  and will wear off a little at a time °Steri-strips : Adhesive strips of tape over your incisions. You may notice a yellowish color on the skin under the steri-strips. This is used to make the   steri-strips stick better. Do not pull the steri-strips off - let them fall off °Staples: Staples may be removed before you leave the hospital °If you go home with staples, call [facility] at for an appointment with your surgeon’s nurse to have staples removed 10 days after surgery, [phone] °Showering: You may shower two (2) days after your surgery unless your surgeon tells you differently °Wash gently around incisions with warm soapy water, rinse well, and gently pat dry  °If you have a drain (tube from your incision), you may need someone to hold this while you shower  °No tub baths until staples are removed and incisions are healed   °  °Medications: Medications should be liquid or crushed if larger than the size of a dime °Extended release pills (medication that releases a little bit at a time through the day) should not be crushed °Depending on the size and number of medications you take, you may need to space (take a few throughout the day)/change the time you take your medications so that you do not over-fill your pouch (smaller stomach) °Make sure you follow-up with your primary care physician to make medication changes needed during rapid weight loss and life-style changes °If you have diabetes, follow up with the doctor that orders your diabetes medication(s) within one week after surgery and check   your blood sugar regularly. °Do not drive while taking narcotics (pain medications) °DO NOT take NSAID'S (Examples of NSAID's include ibuprofen, naproxen)  °Diet:                    First 2 Weeks ° You will see the nutritionist about two (2) weeks after your surgery. The nutritionist will increase the types of foods you can eat if you are handling liquids well: °If you have severe vomiting or nausea  and cannot handle clear liquids lasting longer than 1 day, call your surgeon  °Protein Shake °Drink at least 2 ounces of shake 5-6 times per day °Each serving of protein shakes (usually 8 - 12 ounces) should have a minimum of:  °15 grams of protein  °And no more than 5 grams of carbohydrate  °Goal for protein each day: °Men = 80 grams per day °Women = 60 grams per day °Protein powder may be added to fluids such as non-fat milk or Lactaid milk or Soy milk (limit to 35 grams added protein powder per serving) ° °Hydration °Slowly increase the amount of water and other clear liquids as tolerated (See Acceptable Fluids) °Slowly increase the amount of protein shake as tolerated  ° Sip fluids slowly and throughout the day °May use sugar substitutes in small amounts (no more than 6 - 8 packets per day; i.e. Splenda) ° °Fluid Goal °The first goal is to drink at least 8 ounces of protein shake/drink per day (or as directed by the nutritionist);  See handout from pre-op Bariatric Education Class for examples of protein shake/drink.   °Slowly increase the amount of protein shake you drink as tolerated °You may find it easier to slowly sip shakes throughout the day °It is important to get your proteins in first °Your fluid goal is to drink 64 - 100 ounces of fluid daily °It may take a few weeks to build up to this °32 oz (or more) should be clear liquids  °And  °32 oz (or more) should be full liquids (see below for examples) °Liquids should not contain sugar, caffeine, or carbonation ° °Clear Liquids: °Water or Sugar-free flavored water (i.e. Fruit H2O, Propel) °Decaffeinated coffee or tea (sugar-free) °Katielynn Horan Lite, Wyler’s Lite, Minute Maid Lite °Sugar-free Jell-O °Bouillon or broth °Sugar-free Popsicle:   *Less than 20 calories each; Limit 1 per day ° °Full Liquids: °Protein Shakes/Drinks + 2 choices per day of other full liquids °Full liquids must be: °No More Than 12 grams of Carbs per serving  °No More Than 3 grams of Fat  per serving °Strained low-fat cream soup °Non-Fat milk °Fat-free Lactaid Milk °Sugar-free yogurt (Dannon Lite & Fit, Greek yogurt) ° ° ° °  °Vitamins and Minerals Start 1 day after surgery unless otherwise directed by your surgeon °Bariatric Specific Complete Multivitamins °Chewable Calcium Citrate with Vitamin D-3 °(Example: 3 Chewable Calcium Plus 600 with Vitamin D-3) °Take 500 mg three (3) times a day for a total of 1500 mg each day °Do not take all 3 doses of calcium at one time as it may cause constipation, and you can only absorb 500 mg  at a time  °Do not mix multivitamins containing iron with calcium supplements; take 2 hours apart ° °Menstruating women and those at risk for anemia (a blood disease that causes weakness) may need extra iron °Talk with your doctor to see if you need more iron °If you need extra iron: Total daily Iron recommendation (including Vitamins) is 50 to 100   mg Iron/day Do not stop taking or change any vitamins or minerals until you talk to your nutritionist or surgeon Your nutritionist and/or surgeon must approve all vitamin and mineral supplements   Activity and Exercise: It is important to continue walking at home.  Limit your physical activity as instructed by your doctor.  During this time, use these guidelines: Do not lift anything greater than ten (10) pounds for at least two (2) weeks Do not go back to work or drive until Engineer, production says you can You may have sex when you feel comfortable  It is VERY important for female patients to use a reliable birth control method; fertility often increases after surgery  Do not get pregnant for at least 18 months Start exercising as soon as your doctor tells you that you can Make sure your doctor approves any physical activity Start with a simple walking program Walk 5-15 minutes each day, 7 days per week.  Slowly increase until you are walking 30-45 minutes per day Consider joining our [facility] program. [phone] or email  [email or web address]   Special Instructions Things to remember:  Use your CPAP when sleeping if this applies to you, do not stop the use of CPAP unless directed by physician after a sleep study [facility] has a free Bariatric Surgery Support Group that meets monthly, the 3rd Thursday, 6 pm.  Please review discharge information for date and location of this meeting. It is very important to keep all follow up appointments with your surgeon, nutritionist, primary care physician, and behavioral health practitioner After the first year, please follow up with your bariatric surgeon and nutritionist at least once a year in order to maintain best weight loss results   [facility]: [place]: [phone] Bariatric Nurse Coordinator: [phone]     Per MD, Oxycodone 5mg  PO IR prescription sent to pharmacy also!

## 2021-11-26 NOTE — Progress Notes (Signed)
PHARMACY CONSULT FOR:  Risk Assessment for Post-Discharge VTE Following Bariatric Surgery  Post-Discharge VTE Risk Assessment: This patient's probability of 30-day post-discharge VTE is increased due to the factors marked:   Female    Age >/=60 years    BMI >/=50 kg/m2    CHF    Dyspnea at Rest    Paraplegia  X Non-gastric-band surgery   X  Operation Time >/=3 hr    Return to OR     Length of Stay >/= 3 d      Hx of VTE   Hypercoagulable condition   Significant venous stasis   Predicted probability of 30-day post-discharge VTE: 0.25%  Other patient-specific factors to consider: no risk factors for portomesenteric vein thrombois  Recommendation for Discharge: No pharmacologic prophylaxis post-discharge Follow daily and recalculate estimated 30d VTE risk if returns to OR or LOS reaches 3 days.  Kimberly Medina is a 56 y.o. 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  Recent Labs    11/26/21 1442  WBC 16.1*  HGB 12.0  HCT 38.2  PLT 398  CREATININE 1.04*  ALBUMIN 3.9  PROT 7.1  AST 49*  ALT 29  ALKPHOS 68  BILITOT 0.7   Estimated Creatinine Clearance: 59.8 mL/min (A) (by C-G formula based on SCr of 1.04 mg/dL (H)).    Past Medical History:  Diagnosis Date   Anxiety    Depression    GERD (gastroesophageal reflux disease)    Hyperlipidemia    Hypertension      Medications Prior to Admission  Medication Sig Dispense Refill Last Dose   atorvastatin (LIPITOR) 20 MG tablet Take 20 mg by mouth at bedtime.   11/25/2021   Cholecalciferol (VITAMIN D3 GUMMIES PO) Take 2 capsules by mouth daily.   11/25/2021   fluticasone (FLONASE) 50 MCG/ACT nasal spray Place 1 spray into both nostrils daily as needed for allergies.   11/26/2021 at 0500    Halobetasol Propionate (LEXETTE) 0.05 % FOAM Apply 1 application topically daily as needed (rash).   Past Month   loratadine (CLARITIN) 10 MG tablet Take 10 mg by mouth daily.   11/26/2021 at 0500   Multiple Vitamin (MULTIVITAMIN WITH MINERALS) TABS tablet Take 3 tablets by mouth daily.      Omega-3 Fatty Acids (FISH OIL) 1200 MG CAPS Take 1,200 mg by mouth 2 (two) times daily.   11/25/2021   omeprazole (PRILOSEC) 40 MG capsule Take 1 capsule (40 mg total) by mouth daily. 30 capsule 0 11/26/2021 at 0500   venlafaxine (EFFEXOR) 100 MG tablet Take 100 mg by mouth daily.   11/26/2021 at 0500   ibuprofen (ADVIL) 200 MG tablet Take 400 mg by mouth every 6 (six) hours as needed for headache or moderate pain.   More than a month   PAXLOVID, 300/100, 20 x 150 MG & 10 x 100MG  TBPK Take 1-2 tablets by mouth See admin instructions. TAKE 2 NIRMATRELVIR TABLETS AND 1 RITONAVIR TABLET TOGETHER BY MOUTH TWICE DAILY FOR 5 DAYS   More than a month   valACYclovir (VALTREX) 1000 MG tablet Take 1,000 mg by mouth 2 (two) times daily as needed (Acute cold sore).   More than a month      , PharmD, BCPS [phone] 11/26/2021, 3:36 PM

## 2021-11-26 NOTE — Progress Notes (Signed)
Cpap set up and plugged in the red outlet. Pt prefer self placement. Pt is using her own nasal pillows and tubing. Encouraged pt to let RN know if help needed from RT.

## 2021-11-26 NOTE — Progress Notes (Signed)

## 2021-11-26 NOTE — Op Note (Signed)
Patient: Kimberly Medina (10-27-1966, [id number])  Date of Surgery: 11/26/2021   Preoperative Diagnosis: Morbid Obesity, Hiatal Hernia  Postoperative Diagnosis: Morbid Obesity, Hiatal Hernia  Surgical Procedure:  Robotic Roux-en-Y Gastric Bypass Robotic Hiatal Hernia Repair Upper Gastrointestinal    Operative Team Members:  Surgeon(s) and Role:    * Eleny Cortez, Hyman Hopes, MD - Primary    * Gaynelle Adu, MD - Assisting   Anesthesiologist: Gaynelle Adu, MD CRNA: Elyn Peers, CRNA; Armistead, Lauree Chandler, CRNA; Ezekiel Ina, CRNA   Anesthesia: General   Fluids:  Total I/O In: 1431 [I.V.:1331; IV Piggyback:100] Out: 50 [Blood:50]  Complications: None  Drains:  none   Specimen: None   Disposition:  PACU - hemodynamically stable.  Plan of Care: Admit to inpatient     Indications for Procedure:  Kimberly Medina is a 56 y.o. female who was seen for bariatric surgery consultation. The patient has morbid obesity with a BMI of Body mass index of 37.37 kg/m, and the following conditions related to obesity: Hyperlipidemia, hypertension, gastroesophageal reflux disease, arthritis, and a hiatal hernia.  We discussed the surgical options to treat obesity and its associated comorbidity. After discussing the available procedures in the region, we discussed in great detail the surgeries I offer: robotic sleeve gastrectomy and robotic roux-en-y gastric bypass. We discussed the procedures themselves as well as their risks, benefits and alternatives. I entered the patient's basic information into the [facility] Metabolic Surgery Risk/Benefit Calculator to facilitate this discussion.  With her reflux issues in the past and the presence of a hiatal hernia, we decided to proceed with robotic gastric bypass with upper endoscopy and hiatal hernia repair.  The procedure itself as well as its risks, benefits and alternatives were discussed and the patient granted consent to proceed.  She  completed the preoperative pathway.  Findings: Normal Anatomy  Infection status: Patient: Private Patient Elective Case Case: Elective Infection Present At Time Of Surgery (PATOS): Some spillage of foregut  and jejunal contents while creating anastomoses   Description of Procedure:   On the date stated above, the patient was taken to the operating room suite and placed in supine positioning.  General endotracheal anesthesia was induced.  A timeout was completed verifying the correct patient, procedure, positioning and equipment needed for the case.  The patient's abdomen was prepped and draped in the usual sterile fashion.  A 5 mm trocar was inserted 22 cm down from the xiphoid and 4-6 cm to the left of midline using the optical technique.  There was no trauma to the underlying viscera with initial trocar placement.  Four robotic trocars were placed across the abdomen at this level.  The robotic stapling trocar was placed in the right most position.  The [facility] liver retractor was placed through the subxiphoid region and under the left lobe of the liver and was connected to the rail of the bed.  A TAP block was placed using marcaine and Exparel under direct vision of the laparoscope.  The [employer] XI robotic platform was docked and we transitioned to robotic surgery.  Using the tip up grasper, fenestrated bipolar, 30 degree camera and Synchroseal from the patient's right to left, we began by repairing the hiatal hernia.  Beginning at the twelve oclock position on the crus, the hernia sac was grasped and reduced form the mediastinum.  The hernia sac was divided to enter the plane between the sac and the mediastinum.  The hernia sac was divided towards the left and  right with continued traction on the hernia sac to reduce it.  A mediastinal dissection was performed to further reduce the hernia sac which facilitated reduction of the stomach as well.  The hernia sac was disconnected from the right  and left crura and subsequently, the hernia sac was dissected from the stomach and esophagus, and excised.  Posterior gastric attachments to the lesser sac and retroperitoneum were divided.  The left crus was further delineated.  A retroesophageal window was created.  A high, circumferential mediastinal dissection was performed in an effort to mobilize the esophagus and provide for adequate intraabdominal esophageal length.  The mediastinal dissection was performed bluntly, with little to no thermal energy.  The anterior and posterior vagus nerves were both identified and preserved.  The crural defect was reapproximated with three, interrupted, 0 Ethibond sutures.  The crural pillars came together well without tearing of the adjacent diaphragmatic tissue.     With the hiatal hernia repaired, I began the gastric bypass portion of the procedure.  I started 4-6 cm down on the lesser curve of the stomach and created a defect in the gastrohepatic ligament tracking behind the lesser curve of the stomach to enter the lesser sac.  I then used multiple white loads of the robotic 60 mm Sureform linear stapler to form the gastric pouch.  I then directed my attention to the lower abdomen.  The omentum was divided with the Synchroseal and I identified the ligament of treitz.  The jejunum was run to a point 50 cm from the ligament of Treitz.  This loop of bowel was then brought into the left upper quadrant, over the transverse colon, between the split omentum.  A 2-0 v-loc suture was used to create the posterior outer row of the gastrojejunal anastomosis.  A window was made in the jejunal mesentery and the jejunum was divided just proximal to the gastrojejunal anastomosis using a white load of the robotic 60 mm Sureform linear stapler to divide the roux limb from the hepatobiliary limb.  I then continued to create the gastrojejunal anastomosis.  An approximately 2 cm gastrotomy was made in the pouch and a matching 2 cm  enterotomy was created in the roux limb.  Then, 3-0 v-loc was used to create a right sided, inner, full thickness layer of the anastomosis.  A second 3-0 v-loc was used to create an left sided, inner, full thickness layer of the anastomosis.  While sewing the anterior inner layer, the Ewald tube was passed through the anastomosis to ensure patency.  The outer, anterior layer was then created using 2-0 v-loc suture.  At this point the gastrojejunal anastomosis was complete and the Ewald tube was removed.  The roux limb was then run 100 cm from the gastrojejunal anastomosis.  This loop of bowel was brought into the left upper quadrant for anastomosis to the hepatobiliary limb.  Enterotomies were created in both limbs using the monopolar scissors.  A robotic 60 mm white load on the Sureform linear stapler was introduced into both limbs and fired to create the jejunojejunostomy.  A second load was fired to connect the distal roux limb to the common channel creating a "W" shaped anastomosis to avoid stenosis at the JJ.  The common enterotomy was closed with a 63mm white load of the stapler which was oversewn with 2-0 vicryl.  The jejunojejunostomy mesenteric defect was closed using running 0-Ethibond suture.    The retro-roux defect was closed, closing the roux limb mesentery to the  transverse mesocolon mesentery using a running 0-Ethibond suture.   I ran the roux limb from the gastrojejunal anastomosis to the jejunojejunostomy and found the anatomy as expected without any twisting of the mesentery.  I then transitioned to endoscopic portion of the procedure.  The adult upper endoscope was inserted into the pouch.  The pouch appeared appropriately sized.  There was good intra-luminal hemostasis.  The endoscope was inserted through the anastomosis into the roux limb.  The anastomosis appeared well formed without any stricture.  The anastomosis was submerged in irrigation in the left upper quadrant and there was no  leakage of air bubbles with endoscopic insufflation suggesting a negative leak test and an air tight anastomosis.    The foregut was decompressed with the endoscope and the endoscope was removed.  The small bowel was released by the tip-up grasper and then the robotic instruments were removed and the robot was undocked.  The stapler port in the right abdomen was closed at the fascial level using 0-vicryl on a PMI suture passer.  The liver retractor was removed under direct vision.  The pneumoperitoneum was evacuated.  The skin was closed using 4-0 Monocryl and Dermabond.  All sponge and needle counts were correct at the end of the case.    Ivar DrapePaul Justinn Welter, MD General, Bariatric, & Minimally Invasive Surgery [facility], [place]

## 2021-11-26 NOTE — Transfer of Care (Signed)
Immediate Anesthesia Transfer of Care Note  Patient: Kimberly Medina  Procedure(s) Performed: ROBOTIC GASTRIC BYPASS RNY WITH UPPER GASTROINTESTINAL ENDOSCOPY (Abdomen) HERNIA REPAIR HIATAL (Abdomen)  Patient Location: PACU  Anesthesia Type:General  Level of Consciousness: awake, alert  and oriented  Airway & Oxygen Therapy: Patient Spontanous Breathing and Patient connected to face mask oxygen  Post-op Assessment: Report given to RN and Post -op Vital signs reviewed and stable  Post vital signs: Reviewed and stable  Last Vitals:  Vitals Value Taken Time  BP 134/79 11/26/21 1415  Temp    Pulse 100 11/26/21 1415  Resp 12 11/26/21 1415  SpO2 96 % 11/26/21 1415  Vitals shown include unvalidated device data.  Last Pain:  Vitals:   11/26/21 0843  TempSrc:   PainSc: 0-No pain         Complications: No notable events documented.

## 2021-11-26 NOTE — H&P (Signed)
Admitting Physician: Maybee  Service: Bariatric surgery  CC: Obesity  Subjective   HPI: Kimberly Medina is an 56 y.o. female who is here for robotic gastric bypass with hiatal hernia repair and upper endoscopy.  Past Medical History:  Diagnosis Date   Anxiety    Depression    GERD (gastroesophageal reflux disease)    Hyperlipidemia    Hypertension     Past Surgical History:  Procedure Laterality Date   ABDOMINAL HYSTERECTOMY     BIOPSY  09/04/2021   Procedure: BIOPSY;  Surgeon: Felicie Morn, MD;  Location: [facility] ENDOSCOPY;  Service: General;;   CESAREAN SECTION     ESOPHAGOGASTRODUODENOSCOPY N/A 09/04/2021   Procedure: ESOPHAGOGASTRODUODENOSCOPY (EGD);  Surgeon: Felicie Morn, MD;  Location: [facility];  Service: General;  Laterality: N/A;    Family History  Problem Relation Age of Onset   Sleep apnea Neg Hx     Social:  reports that she has never smoked. She has never been exposed to tobacco smoke. She has never used smokeless tobacco. She reports current alcohol use. She reports that she does not use drugs.  Allergies: No Known Allergies  Medications: Current Outpatient Medications  Medication Instructions   atorvastatin (LIPITOR) 20 mg, Oral, Daily at bedtime   Cholecalciferol (VITAMIN D3 GUMMIES PO) 2 capsules, Oral, Daily   Fish Oil 1,200 mg, Oral, 2 times daily   fluticasone (FLONASE) 50 MCG/ACT nasal spray 1 spray, Each Nare, Daily PRN   Halobetasol Propionate (LEXETTE) [id number] % FOAM 1 application, Topical, Daily PRN   ibuprofen (ADVIL) 400 mg, Oral, Every 6 hours PRN   loratadine (CLARITIN) 10 mg, Oral, Daily   Multiple Vitamin (MULTIVITAMIN WITH MINERALS) TABS tablet 3 tablets, Oral, Daily   omeprazole (PRILOSEC) 40 mg, Oral, Daily   PAXLOVID, 300/100, 20 x 150 MG & 10 x 100MG  TBPK 1-2 tablets, Oral, See admin instructions, TAKE 2 NIRMATRELVIR TABLETS AND 1 RITONAVIR TABLET TOGETHER BY MOUTH TWICE DAILY FOR 5 DAYS    valACYclovir (VALTREX) 1,000 mg, Oral, 2 times daily PRN   venlafaxine (EFFEXOR) 100 mg, Oral, Daily    ROS - all of the below systems have been reviewed with the patient and positives are indicated with bold text General: chills, fever or night sweats Eyes: blurry vision or double vision ENT: epistaxis or sore throat Allergy/Immunology: itchy/watery eyes or nasal congestion Hematologic/Lymphatic: bleeding problems, blood clots or swollen lymph nodes Endocrine: temperature intolerance or unexpected weight changes Breast: new or changing breast lumps or nipple discharge Resp: cough, shortness of breath, or wheezing CV: chest pain or dyspnea on exertion GI: as per HPI GU: dysuria, trouble voiding, or hematuria MSK: joint pain or joint stiffness Neuro: TIA or stroke symptoms Derm: pruritus and skin lesion changes Psych: anxiety and depression  Objective   PE Blood pressure (!) 168/92, pulse 80, temperature 98.7 F (37.1 C), temperature source Oral, resp. rate 15, height 5' (1.524 m), weight 86.8 kg, SpO2 100 %. Constitutional: NAD; conversant; no deformities Eyes: Moist conjunctiva; no lid lag; anicteric; PERRL Neck: Trachea midline; no thyromegaly Lungs: Normal respiratory effort; no tactile fremitus CV: RRR; no palpable thrills; no pitting edema GI: Abd Soft, non-tender; no palpable hepatosplenomegaly MSK: Normal range of motion of extremities; no clubbing/cyanosis Psychiatric: Appropriate affect; alert and oriented x3 Lymphatic: No palpable cervical or axillary lymphadenopathy  No results found for this or any previous visit (from the past 24 hour(s)).  Imaging Orders  No imaging studies ordered today  Assessment and Plan   Kimberly Medina is a 56 y.o. female who was seen for bariatric surgery consultation. The patient has morbid obesity with a BMI of Body mass index is 37.37 kg/m. and the following conditions related to obesity: Hyperlipidemia, hypertension,  gastroesophageal reflux disease, arthritis, and hiatal hernia.  Today we discussed the surgical options to treat obesity and its associated comorbidity. After discussing the available procedures in the region, we discussed in great detail the surgeries I offer: robotic sleeve gastrectomy and robotic roux-en-y gastric bypass. We discussed the procedures themselves as well as their risks, benefits and alternatives. I entered the patient's basic information into the [facility] Metabolic Surgery Risk/Benefit Calculator to facilitate this discussion.  After a full discussion and all questions answered, the patient is interested in pursuing a robotic gastric bypass with hiatal hernia repair and upper endoscopy.  She completed the preoperative pathway: - Bloodwork - Dietician consult - Completed with Ubaldo Glassing Scotece - Chest x-ray - EKG - Psychology evaluation - Sleep study - Upper endoscopy with biopsy.   We will proceed as scheduled today with surgery.    Felicie Morn, MD  [facility] Use [email or web address] to contact on call provider

## 2021-11-26 NOTE — Anesthesia Postprocedure Evaluation (Signed)
Anesthesia Post Note  Patient: Kimberly Medina  Procedure(s) Performed: ROBOTIC GASTRIC BYPASS RNY WITH UPPER GASTROINTESTINAL ENDOSCOPY (Abdomen) HERNIA REPAIR HIATAL (Abdomen)     Patient location during evaluation: PACU Anesthesia Type: General Level of consciousness: awake and alert Pain management: pain level controlled Vital Signs Assessment: post-procedure vital signs reviewed and stable Respiratory status: spontaneous breathing, nonlabored ventilation and respiratory function stable Cardiovascular status: blood pressure returned to baseline and stable Postop Assessment: no apparent nausea or vomiting Anesthetic complications: no   No notable events documented.  Last Vitals:  Vitals:   11/26/21 1515 11/26/21 1533  BP: 132/83 (!) 160/96  Pulse: 100 100  Resp: 13 14  Temp: 36.7 C 36.6 C  SpO2: 94% 98%    Last Pain:  Vitals:   11/26/21 1533  TempSrc: Oral  PainSc:                  Tavish Gettis,W. EDMOND

## 2021-11-26 NOTE — Anesthesia Preprocedure Evaluation (Addendum)
Anesthesia Evaluation  Patient identified by MRN, date of birth, ID band Patient awake    Reviewed: Allergy & Precautions, H&P , NPO status , Patient's Chart, lab work & pertinent test results  Airway Mallampati: II  TM Distance: >3 FB Neck ROM: Full    Dental no notable dental hx. (+) Teeth Intact, Dental Advisory Given   Pulmonary neg pulmonary ROS,    Pulmonary exam normal breath sounds clear to auscultation       Cardiovascular hypertension,  Rhythm:Regular Rate:Normal     Neuro/Psych Anxiety Depression negative neurological ROS     GI/Hepatic Neg liver ROS, GERD  Medicated,  Endo/Other  Morbid obesity  Renal/GU negative Renal ROS  negative genitourinary   Musculoskeletal   Abdominal   Peds  Hematology negative hematology ROS (+)   Anesthesia Other Findings   Reproductive/Obstetrics negative OB ROS                            Anesthesia Physical Anesthesia Plan  ASA: 2  Anesthesia Plan: General   Post-op Pain Management: Ofirmev IV (intra-op) and Toradol IV (intra-op)   Induction: Intravenous  PONV Risk Score and Plan: 4 or greater and Dexamethasone, Midazolam, Ondansetron and Scopolamine patch - Pre-op  Airway Management Planned: Oral ETT  Additional Equipment:   Intra-op Plan:   Post-operative Plan: Extubation in OR  Informed Consent: I have reviewed the patients History and Physical, chart, labs and discussed the procedure including the risks, benefits and alternatives for the proposed anesthesia with the patient or authorized representative who has indicated his/her understanding and acceptance.     Dental advisory given  Plan Discussed with: CRNA  Anesthesia Plan Comments:        Anesthesia Quick Evaluation

## 2021-11-26 NOTE — Progress Notes (Signed)
Called Dr. Dossie Der in OR 4 to inform him Kimberly Medina has no orders in epic.  He states he is about done with his current case and will put orders in epic when he is done.

## 2021-11-27 ENCOUNTER — Encounter (HOSPITAL_COMMUNITY): Payer: Self-pay | Admitting: Surgery

## 2021-11-27 LAB — CBC WITH DIFFERENTIAL/PLATELET
Abs Immature Granulocytes: 0.05 10*3/uL (ref 0.00–0.07)
Basophils Absolute: 0 10*3/uL (ref 0.0–0.1)
Basophils Relative: 0 %
Eosinophils Absolute: 0 10*3/uL (ref 0.0–0.5)
Eosinophils Relative: 0 %
HCT: 33.8 % — ABNORMAL LOW (ref 36.0–46.0)
Hemoglobin: 10.5 g/dL — ABNORMAL LOW (ref 12.0–15.0)
Immature Granulocytes: 0 %
Lymphocytes Relative: 14 %
Lymphs Abs: 1.7 10*3/uL (ref 0.7–4.0)
MCH: 27 pg (ref 26.0–34.0)
MCHC: 31.1 g/dL (ref 30.0–36.0)
MCV: 86.9 fL (ref 80.0–100.0)
Monocytes Absolute: 0.9 10*3/uL (ref 0.1–1.0)
Monocytes Relative: 7 %
Neutro Abs: 9.3 10*3/uL — ABNORMAL HIGH (ref 1.7–7.7)
Neutrophils Relative %: 79 %
Platelets: 321 10*3/uL (ref 150–400)
RBC: 3.89 MIL/uL (ref 3.87–5.11)
RDW: 15.5 % (ref 11.5–15.5)
WBC: 11.9 10*3/uL — ABNORMAL HIGH (ref 4.0–10.5)
nRBC: 0 % (ref 0.0–0.2)

## 2021-11-27 MED ORDER — PANTOPRAZOLE SODIUM 40 MG PO TBEC: 40.0000 mg | DELAYED_RELEASE_TABLET | Freq: Every day | ORAL | 0 refills | Status: AC

## 2021-11-27 MED ORDER — SIMETHICONE 80 MG PO CHEW
80.0000 mg | CHEWABLE_TABLET | Freq: Four times a day (QID) | ORAL | Status: DC | PRN
  Administered 2021-11-27: 80 mg via ORAL
  Filled 2021-11-27: qty 1

## 2021-11-27 MED ORDER — ONDANSETRON 4 MG PO TBDP: 4.0000 mg | ORAL_TABLET | Freq: Four times a day (QID) | ORAL | 0 refills | Status: AC | PRN

## 2021-11-27 MED ORDER — ACETAMINOPHEN 500 MG PO TABS: 1000.0000 mg | ORAL_TABLET | Freq: Three times a day (TID) | ORAL | 0 refills | Status: AC

## 2021-11-27 NOTE — Discharge Summary (Signed)
°  Patient ID: Kimberly Medina [id number] 55 y.o. 1966/09/07  11/26/2021  Discharge date and time: 11/27/2021  Admitting Physician: Hyman Hopes Kimberly Medina  Discharge Physician: Hyman Hopes Kimberly Medina  Admission Diagnoses: Obesity with serious comorbidity [E66.9] Patient Active Problem List   Diagnosis Date Noted   Obesity with serious comorbidity 11/26/2021     Discharge Diagnoses: Morbid obesity, BMI 37, hiatal hernia Patient Active Problem List   Diagnosis Date Noted   Obesity with serious comorbidity 11/26/2021    Operations: Procedure(s): ROBOTIC GASTRIC BYPASS RNY WITH UPPER GASTROINTESTINAL ENDOSCOPY HERNIA REPAIR HIATAL  Admission Condition: good  Discharged Condition: good  Indication for Admission: Morbid obesity, hiatal hernia  Hospital Course: Kimberly Medina is a 56 year old female with morbid obesity who underwent robotic hiatal hernia repair and gastric bypass with upper endoscopy on 11/26/20.  She recovered well per protocol and was discharged.  Consults: None  Significant Diagnostic Studies: none  Treatments: surgery: as above  Disposition: Home  Patient Instructions:  Allergies as of 11/27/2021   No Known Allergies      Medication List     STOP taking these medications    ibuprofen 200 MG tablet Commonly known as: ADVIL   Paxlovid (300/100) 20 x 150 MG & 10 x 100MG  Tbpk Generic drug: nirmatrelvir & ritonavir       TAKE these medications    acetaminophen 500 MG tablet Commonly known as: TYLENOL Take 2 tablets (1,000 mg total) by mouth every 8 (eight) hours for 5 days.   atorvastatin 20 MG tablet Commonly known as: LIPITOR Take 20 mg by mouth at bedtime.   Fish Oil 1200 MG Caps Take 1,200 mg by mouth 2 (two) times daily.   fluticasone 50 MCG/ACT nasal spray Commonly known as: FLONASE Place 1 spray into both nostrils daily as needed for allergies.   Lexette 0.05 % Foam Apply 1 application topically daily as needed (rash).    loratadine 10 MG tablet Commonly known as: CLARITIN Take 10 mg by mouth daily.   multivitamin with minerals Tabs tablet Take 3 tablets by mouth daily.   omeprazole 40 MG capsule Commonly known as: PRILOSEC Take 1 capsule (40 mg total) by mouth daily.   ondansetron 4 MG disintegrating tablet Commonly known as: ZOFRAN-ODT Take 1 tablet (4 mg total) by mouth every 6 (six) hours as needed for nausea or vomiting.   pantoprazole 40 MG tablet Commonly known as: PROTONIX Take 1 tablet (40 mg total) by mouth daily.   valACYclovir 1000 MG tablet Commonly known as: VALTREX Take 1,000 mg by mouth 2 (two) times daily as needed (Acute cold sore).   venlafaxine 100 MG tablet Commonly known as: EFFEXOR Take 100 mg by mouth daily.   VITAMIN D3 GUMMIES PO Take 2 capsules by mouth daily.        Activity: no heavy lifting for 4 weeks Diet:  Bariatric diet protocol Wound Care: keep wound clean and dry  Follow-up:  With Dr. per bariatric surgery follow up schedule.  Signed: Dossie Der Kimberly Medina General, Bariatric, & Minimally Invasive Surgery [facility], [facility]   11/27/2021, 4:55 PM

## 2021-11-27 NOTE — Progress Notes (Signed)
Patient alert and oriented, pain is controlled. Patient is tolerating fluids, advanced to protein shake today, patient is tolerating well. Reviewed Gastric Bypass discharge instructions with patient and patient is able to articulate understanding. Provided information on BELT program, Support Group and [facility] outpatient pharmacy. All questions answered, will continue to monitor.    

## 2021-11-27 NOTE — Progress Notes (Signed)
Transition of Care [facility]) Screening Note  Patient Details  Name: VENISHA BOEHNING Date of Birth: 1966-06-21  Transition of Care [facility]) CM/SW Contact:    Ewing Schlein, LCSW Phone Number: 11/27/2021, 10:10 AM  Transition of Care Department [facility]) has reviewed patient and no TOC needs have been identified at this time. We will continue to monitor patient advancement through interdisciplinary progression rounds. If new patient transition needs arise, please place a TOC consult.

## 2021-11-28 NOTE — Progress Notes (Signed)
24hr fluid recall prior to surgery: .  Per dehydration protocol, will call pt to f/u within one week post op.

## 2021-11-30 ENCOUNTER — Telehealth (HOSPITAL_COMMUNITY): Payer: Self-pay | Admitting: *Deleted

## 2021-11-30 NOTE — Telephone Encounter (Signed)
1.  Tell me about your pain and pain management? Pt denies any pain.  2.  Let's talk about fluid intake.  How much total fluid are you taking in? Pt states that she is working to meet goal of 64 oz of fluid today.  Pt has been able to consume approx. 30 oz of fluid per day since surgery.  Pt plans to increase clear liquids to meet fluid goals and will try alkaline water. Pt instructed to assess status and suggestions daily utilizing Hydration Action Plan on discharge folder and to call [facility] if in the "red zone".   3.  How much protein have you taken in the last 2 days? Pt states that she is working to meet goal of goal of 60g of protein today.  Pt states that yesterday, she got 20g of protein. Pt gets nausea after drinking protein shake.  Discussed trying other brands such as Fairlife, non-flavored powder protein, and protein waters to meet goal.  Pt plans to try options of protein.  4.  Have you had nausea?  Tell me about when have experienced nausea and what you did to help? Pt currently denies nausea.   5.  Has the frequency or color changed with your urine? Pt states that she is urinating "fine" with no changes in frequency or urgency.     6.  Tell me what your incisions look like? "Incisions look fine". Pt denies a fever, chills.  Pt states incisions are not swollen, open, or draining.  Pt encouraged to call [facility] if incisions change.   7.  Have you been passing gas? BM? Pt states that s/he has not had a BM.  Pt instructed to take either Miralax or MoM as instructed per "Gastric Bypass/Sleeve Discharge Home Care Instructions".  Pt states that she has taken Miralax twice and a suppository. Pt to call surgeon's office if not able to have BM with medication.   8.  If a problem or question were to arise who would you call?  Do you know contact numbers for [facility], [facility], and [facility]? Pt denies dehydration symptoms or "red zone" symptoms.  Pt can describe s/sx of dehydration.  Pt knows to call [facility] for  surgical, [facility] for nutrition, and [facility] for non-urgent questions or concerns.   9.  How has the walking going? Pt states she is walking around and able to be active without difficulty.   10. Are you still using your incentive spirometer?  If so, how often? Pt states that she is using I.S. "4x a day". Pt encouraged to use incentive spirometer, at least 10x every hour while awake until she sees the surgeon.  11.  How are your vitamins and calcium going?  How are you taking them? Pt has not started taking supplements.  Encouraged to begin supplements over the weekend once able to increase protein and fluids.   Reminded patient that the first 30 days post-operatively are important for successful recovery.  Practice good hand hygiene, wearing a mask when appropriate (since optional in most places), and minimizing exposure to people who live outside of the home, especially if they are exhibiting any respiratory, GI, or illness-like symptoms.

## 2021-12-01 ENCOUNTER — Telehealth (HOSPITAL_COMMUNITY): Payer: Self-pay | Admitting: *Deleted

## 2021-12-01 NOTE — Telephone Encounter (Signed)
F/u phone call with patient.  Pt states that she is doing "much better".  She was able to get the alkaline water and Fairlife protein shakes.  She has been able to consume approx. 46 fl oz and 55g of protein so far today.  Pt plans to continue to work towards fluid and protein goals.  Pt was able to have BM yesterday also.  Will continue to monitor as needed.

## 2021-12-10 ENCOUNTER — Ambulatory Visit: Payer: BC Managed Care – PPO | Admitting: Neurology

## 2021-12-11 ENCOUNTER — Encounter: Payer: BC Managed Care – PPO | Attending: Surgery | Admitting: Skilled Nursing Facility1

## 2021-12-11 ENCOUNTER — Other Ambulatory Visit: Payer: Self-pay

## 2021-12-11 DIAGNOSIS — E669 Obesity, unspecified: Secondary | ICD-10-CM | POA: Insufficient documentation

## 2021-12-11 NOTE — Progress Notes (Signed)
2 Week Post-Operative Nutrition Class   Patient was seen on 12/11/2021 for Post-Operative Nutrition education at the [facility].    Surgery date: 11/26/2021 Surgery type: RYGB Start weight at [facility]: 190 Weight today: 179.1 Bowel Habits: Every day to every other day no complaints   Body Composition Scale 12/11/2021  Current Body Weight 179.1  Total Body Fat % 40.4  Visceral Fat 13  Fat-Free Mass % 59.5   Total Body Water % 44.2  Muscle-Mass lbs 27  BMI 34.3  Body Fat Displacement          Torso  lbs 44.7         Left Leg  lbs 8.9         Right Leg  lbs 8.9         Left Arm  lbs 4.4         Right Arm   lbs 4.4      The following the learning objectives were met by the patient during this course: Identifies Phase 3 (Soft, High Proteins) Dietary Goals and will begin from 2 weeks post-operatively to 2 months post-operatively Identifies appropriate sources of fluids and proteins  Identifies appropriate fat sources and healthy verses unhealthy fat types   States protein recommendations and appropriate sources post-operatively Identifies the need for appropriate texture modifications, mastication, and bite sizes when consuming solids Identifies appropriate fat consumption and sources Identifies appropriate multivitamin and calcium sources post-operatively Describes the need for physical activity post-operatively and will follow MD recommendations States when to call healthcare provider regarding medication questions or post-operative complications   Handouts given during class include: Phase 3A: Soft, High Protein Diet Handout Phase 3 High Protein Meals Healthy Fats   Follow-Up Plan: Patient will follow-up at [facility] in 6 weeks for 2 month post-op nutrition visit for diet advancement per MD.

## 2021-12-17 ENCOUNTER — Telehealth: Payer: Self-pay | Admitting: Skilled Nursing Facility1

## 2021-12-17 NOTE — Telephone Encounter (Signed)
RD called pt to verify fluid intake once starting soft, solid proteins 2 week post-bariatric surgery.   Daily Fluid intake:  Daily Protein intake: Bowel Habits:   Concerns/issues:    LVM 

## 2022-01-22 ENCOUNTER — Other Ambulatory Visit: Payer: Self-pay

## 2022-01-22 ENCOUNTER — Encounter: Payer: BC Managed Care – PPO | Attending: Surgery | Admitting: Skilled Nursing Facility1

## 2022-01-22 DIAGNOSIS — E669 Obesity, unspecified: Secondary | ICD-10-CM | POA: Insufficient documentation

## 2022-01-22 NOTE — Progress Notes (Signed)
Bariatric Nutrition Follow-Up Visit ?Medical Nutrition Therapy  ? ? ?NUTRITION ASSESSMENT ?  ? ?Surgery date: 11/26/2021 ?Surgery type: RYGB ?Start weight at [facility]: 190 pounds ?Weight today: 161 pounds ?Bowel Habits: Every day to every other day no complaints ? ?Clinical  ?Medical hx: GERD, hyperlipidemia, HTN ?Medications: see list; omeprazole   ?Labs:  ?Notable signs/symptoms: constipation  ?Any previous deficiencies? No ?  ?Body Composition Scale 12/11/2021 01/22/2022  ?Current Body Weight 179.1 161  ?Total Body Fat % 40.4 37.2  ?Visceral Fat 13 11  ?Fat-Free Mass % 59.5 62.7  ? Total Body Water % 44.2 45.8  ?Muscle-Mass lbs 27 26.8  ?BMI 34.3 30.8  ?Body Fat Displacement    ?       Torso  lbs 44.7 36.9  ?       Left Leg  lbs 8.9 7.3  ?       Right Leg  lbs 8.9 7.3  ?       Left Arm  lbs 4.4 3.6  ?       Right Arm   lbs 4.4 3.6  ? ?  ?Lifestyle & Dietary Hx ? ?Pt states sometimes her throat feels tight.   ?Pt states she thinks maybe her sips are too small and getting in the way of her fluid as well as having difficulty not drinking with meals. Pt states she is experiencing dry mouth.  ? ?Estimated daily fluid intake: 30 oz ?Estimated daily protein intake: 80 g ?Supplements: multi and calcium ?Current average weekly physical activity: walking about 1 mile her dog about 2 times a walk and bike or treadmill in the gym once a week ? ?24-Hr Dietary Recall ?First Meal: unflavored protein powder + jello ?Snack: protein shake  ?Second Meal: black beans ?Snack: yogurt and jello ?Third Meal: fish or chicken and beans ?Snack: cheese straws ?Beverages: water + flavoring, water ? ?Post-Op Goals/ Signs/ Symptoms ?Using straws: no ?Drinking while eating: no ?Chewing/swallowing difficulties: no ?Changes in vision: no ?Changes to mood/headaches: no ?Hair loss/changes to skin/nails: no ?Difficulty focusing/concentrating: no ?Sweating: no ?Limb weakness: no ?Dizziness/lightheadedness: no ?Palpitations: no  ?Carbonated/caffeinated  beverages: no ?N/V/D/C/Gas: taking mirilax throughout the week ?Abdominal pain: no ?Dumping syndrome: no ? ?  ?NUTRITION DIAGNOSIS  ?Overweight/obesity ([place]-3.3) related to past poor dietary habits and physical inactivity as evidenced by completed bariatric surgery and following dietary guidelines for continued weight loss and healthy nutrition status. ?  ?  ?NUTRITION INTERVENTION ?Nutrition counseling (C-1) and education (E-2) to facilitate bariatric surgery goals, including: ?The importance of consuming adequate calories as well as certain nutrients daily due to the body's need for essential vitamins, minerals, and fats ?The importance of daily physical activity and to reach a goal of at least 150 minutes of moderate to vigorous physical activity weekly (or as directed by their physician) due to benefits such as increased musculature and improved lab values ?The importance of intuitive eating specifically learning hunger-satiety cues and understanding the importance of learning a new body: The importance of mindful eating to avoid grazing behaviors  ? ?Goals: ?-take the capsule form of the multivitamin  ?-first goal get up to 45 ounces and next goal up to 65 ounces  ?-add in resistance 2-3 days a week ?-increase intensisity of cardio ?-Continue to aim for a minimum of 64 fluid ounces 7 days a week with at least 30 ounces being plain water ?-Eat non-starchy vegetables 2 times a day 7 days a week ?-Start out with soft cooked vegetables today  and tomorrow; if tolerated begin to eat raw vegetables or cooked including salads ?-Eat your 3 ounces of protein first then start in on your non-starchy vegetables; once you understand how much of your meal leads to satisfaction and not full while still eating 3 ounces of protein and non-starchy vegetables you can eat them in any order  ?-Continue to aim for 30 minutes of activity at least 5 times a week ?-Do NOT cook with/add to your food: alfredo sauce, cheese sauce, barbeque  sauce, ketchup, fat back, butter, bacon grease, grease, Crisco, OR SUGAR ?-add in fruit limit to 1-2 servings per day ? ?Handouts Provided Include  ?Phase 4  ? ?Learning Style & Readiness for Change ?Teaching method utilized: Visual & Auditory  ?Demonstrated degree of understanding via: Teach Back  ?Readiness Level: action ?Barriers to learning/adherence to lifestyle change: none identified  ? ?RD's Notes for Next Visit ?Assess adherence to pt chosen goals  ? ? ?MONITORING & EVALUATION ?Dietary intake, weekly physical activity, body weight ? ?Next Steps ?Patient is to follow-up in 3 months ?

## 2022-04-17 ENCOUNTER — Encounter: Payer: BC Managed Care – PPO | Attending: Surgery | Admitting: Skilled Nursing Facility1

## 2022-04-17 ENCOUNTER — Encounter: Payer: Self-pay | Admitting: Skilled Nursing Facility1

## 2022-04-17 DIAGNOSIS — E669 Obesity, unspecified: Secondary | ICD-10-CM | POA: Insufficient documentation

## 2022-04-17 NOTE — Progress Notes (Signed)
Bariatric Nutrition Follow-Up Visit Medical Nutrition Therapy    NUTRITION ASSESSMENT    Surgery date: 11/26/2021 Surgery type: RYGB Start weight at [facility]: 190 pounds Weight today: 137 pounds   Clinical  Medical hx: GERD, hyperlipidemia, HTN Medications: see list; omeprazole   Labs:  Notable signs/symptoms: constipation  Any previous deficiencies? No   Body Composition Scale 12/11/2021 01/22/2022 04/17/2022  Current Body Weight 179.1 161 137.3  Total Body Fat % 40.4 37.2 32  Visceral Fat _0 Fat-Free Mass % 59.5 62.7 67.9   Total Body Water % 44.2 45.8 48.4  Muscle-Mass lbs 27 26.8 26.3  BMI 34.3 30.8 26.3  Body Fat Displacement            Torso  lbs 44.7 36.9 27         Left Leg  lbs 8.9 7.3 5.4         Right Leg  lbs 8.9 7.3 5.4         Left Arm  lbs 4.4 3.6 2.7         Right Arm   lbs 4.4 3.6 2.7     Lifestyle & Dietary Hx  Pt state she feels like she has lots of energy.  Pt states she feels strong. Pt states she has had some hair loss.  Pt states chicken feels heavy.  Pt states she thinks she needs to get to 120 pounds: dietitian advised at this point a slowed weight loss would be advisable as she is very nearly at an appropriate weight for her frame. Pt feels uncomfortable with the fact she has already reached her weight goal at 6 months after surgery: dietitian advised at a weight of 190 prior to surgery it is appropriate to have met her weight goal at this time frame.   Estimated daily fluid intake: 30 oz Estimated daily protein intake: 80 g Supplements: multi and calcium Current average weekly physical activity: walking 7 days 30 minutes  24-Hr Dietary Recall First Meal: protein shake (fairlife) Snack: yogurt  Second Meal: black beans Snack: grapes or watermelon Third Meal: fish or butterbean  Snack: cheese straws Beverages: water + flavoring, water  Post-Op Goals/ Signs/ Symptoms Using straws: no Drinking while eating: no Chewing/swallowing  difficulties: no Changes in vision: no Changes to mood/headaches: no Hair loss/changes to skin/nails: yes Difficulty focusing/concentrating: no Sweating: no Limb weakness: no Dizziness/lightheadedness: no Palpitations: no  Carbonated/caffeinated beverages: no N/V/D/C/Gas: taking mirilax throughout the week Abdominal pain: no Dumping syndrome: no    NUTRITION DIAGNOSIS  Overweight/obesity ([place]-3.3) related to past poor dietary habits and physical inactivity as evidenced by completed bariatric surgery and following dietary guidelines for continued weight loss and healthy nutrition status.     NUTRITION INTERVENTION Nutrition counseling (C-1) and education (E-2) to facilitate bariatric surgery goals, including: The importance of consuming adequate calories as well as certain nutrients daily due to the body's need for essential vitamins, minerals, and fats The importance of daily physical activity and to reach a goal of at least 150 minutes of moderate to vigorous physical activity weekly (or as directed by their physician) due to benefits such as increased musculature and improved lab values The importance of intuitive eating specifically learning hunger-satiety cues and understanding the importance of learning a new body: The importance of mindful eating to avoid grazing behaviors  Encouraged patient to honor their body's internal hunger and fullness cues.  Throughout the day, check in mentally and rate hunger. Stop eating when satisfied not full  regardless of how much food is left on the plate.  Get more if still hungry 20-30 minutes later.  The key is to honor satisfaction so throughout the meal, rate fullness factor and stop when comfortably satisfied not physically full. The key is to honor hunger and fullness without any feelings of guilt or shame.  Pay attention to what the internal cues are, rather than any external factors. This will enhance the confidence you have in listening to your  own body and following those internal cues enabling you to increase how often you eat when you are hungry not out of appetite and stop when you are satisfied not full.  Encouraged pt to continue to eat balanced meals inclusive of non starchy vegetables 2 times a day 7 days a week Encouraged pt to choose lean protein sources: limiting beef, pork, sausage, hotdogs, and lunch meat Encourage pt to choose healthy fats such as plant based limiting animal fats Encouraged pt to continue to drink a minium 64 fluid ounces with half being plain water to satisfy proper hydration  Why you need complex carbohydrates: Whole grains and other complex carbohydrates are required to have a healthy diet. Whole grains provide fiber which can help with blood glucose levels and help keep you satiated. Fruits and starchy vegetables provide essential vitamins and minerals required for immune function, eyesight support, brain support, bone density, wound healing and many other functions within the body. According to the current evidenced based 2020-2025 Dietary Guidelines for Americans, complex carbohydrates are part of a healthy eating pattern which is associated with a decreased risk for type 2 diabetes, cancers, and cardiovascular disease.     Handouts Provided Include  Phase 7  Learning Style & Readiness for Change Teaching method utilized: Visual & Auditory  Demonstrated degree of understanding via: Teach Back  Readiness Level: action Barriers to learning/adherence to lifestyle change: none identified   RD's Notes for Next Visit Assess adherence to pt chosen goals    MONITORING & EVALUATION Dietary intake, weekly physical activity, body weight  Next Steps Patient is to follow-up in 2-3 months

## 2022-06-18 ENCOUNTER — Ambulatory Visit: Payer: BC Managed Care – PPO | Admitting: Skilled Nursing Facility1

## 2022-06-20 ENCOUNTER — Encounter: Payer: BC Managed Care – PPO | Attending: Surgery | Admitting: Skilled Nursing Facility1

## 2022-06-20 ENCOUNTER — Encounter: Payer: Self-pay | Admitting: Skilled Nursing Facility1

## 2022-06-20 DIAGNOSIS — E669 Obesity, unspecified: Secondary | ICD-10-CM | POA: Diagnosis present

## 2022-06-20 NOTE — Progress Notes (Signed)
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 No   Body Composition Scale 12/11/2021 01/22/2022 04/17/2022 06/20/2022  Current Body Weight 179.1 161 137.3 131  Total Body Fat % 40.4 37.2 32 27.6  Visceral Fat 13 11 8 7   Fat-Free Mass % 59.5 62.7 67.9 72.3   Total Body Water % 44.2 45.8 48.4 50.6  Muscle-Mass lbs 27 26.8 26.3 27.9  BMI 34.3 30.8 26.3 25  Body Fat Displacement             Torso  lbs 44.7 36.9 27 22.3         Left Leg  lbs 8.9 7.3 5.4 4.4         Right Leg  lbs 8.9 7.3 5.4 4.4         Left Arm  lbs 4.4 3.6 2.7 2.2         Right Arm   lbs 4.4 3.6 2.7 2.2     Lifestyle & Dietary Hx   Pt states she feels pressure in her chest when she eats foods such as cheese, nuts, raisins, all meats but yogurt and beans do fine.  Pt states she really enjoys activity.    Estimated daily fluid intake: 96 oz Estimated daily protein intake: 80 g Supplements: multi: and calcium Current average weekly physical activity: walking 7 days 30 minutes + 30 minute circuit of weight machines   24-Hr Dietary Recall First Meal: protein shake (fairlife) Snack: yogurt + nut/cheese/raisin Snack: 100 calorie snack nuts/raisin Second Meal: protein bar (200 calories) Snack:  Third Meal: half piece of toast + tomato + bacon + mayo Snack: Beverages: water + flavoring, water  Post-Op Goals/ Signs/ Symptoms Using straws: no Drinking while eating: no Chewing/swallowing difficulties: no Changes in vision: no Changes to mood/headaches: no Hair loss/changes to skin/nails: yes Difficulty focusing/concentrating: no Sweating: no Limb weakness:  no Dizziness/lightheadedness: no Palpitations: no  Carbonated/caffeinated beverages: no N/V/D/C/Gas: taking mirilax throughout the week Abdominal pain: no Dumping syndrome: no    NUTRITION DIAGNOSIS  Overweight/obesity ([place]-3.3) related to past poor dietary habits and physical inactivity as evidenced by completed bariatric surgery and following dietary guidelines for continued weight loss and healthy nutrition status.     NUTRITION INTERVENTION Nutrition counseling (C-1) and education (E-2) to facilitate bariatric surgery goals, including: The importance of consuming adequate calories as well as certain nutrients daily due to the body's need for essential vitamins, minerals, and fats The importance of daily physical activity and to reach a goal of at least 150 minutes of moderate to vigorous physical activity weekly (or as directed by their physician) due to benefits such as increased musculature and improved lab values The importance of intuitive eating specifically learning hunger-satiety cues and understanding the importance of learning a new body: The importance of mindful eating to avoid grazing behaviors  Encouraged patient to honor their body's internal hunger and fullness cues.  Throughout the day, check in mentally and rate hunger. Stop eating when satisfied not full regardless of how much food is left on the plate.  Get more if still hungry 20-30 minutes later.  The key is to honor satisfaction so throughout the meal, rate fullness factor and stop when comfortably satisfied not  physically full. The key is to honor hunger and fullness without any feelings of guilt or shame.  Pay attention to what the internal cues are, rather than any external factors. This will enhance the confidence you have in listening to your own body and following those internal cues enabling you to increase how often you eat when you are hungry not out of appetite and stop when you are satisfied not full.   Encouraged pt to continue to eat balanced meals inclusive of non starchy vegetables 2 times a day 7 days a week Encouraged pt to choose lean protein sources: limiting beef, pork, sausage, hotdogs, and lunch meat Encourage pt to choose healthy fats such as plant based limiting animal fats Encouraged pt to continue to drink a minium 64 fluid ounces with half being plain water to satisfy proper hydration  Why you need complex carbohydrates: Whole grains and other complex carbohydrates are required to have a healthy diet. Whole grains provide fiber which can help with blood glucose levels and help keep you satiated. Fruits and starchy vegetables provide essential vitamins and minerals required for immune function, eyesight support, brain support, bone density, wound healing and many other functions within the body. According to the current evidenced based 2020-2025 Dietary Guidelines for Americans, complex carbohydrates are part of a healthy eating pattern which is associated with a decreased risk for type 2 diabetes, cancers, and cardiovascular disease.   Goals: -be sure to hit a minimum of 50 grams of carbohydrate per day not including fiber from complex carbohydrate sources -try checking in on chewing well, taking small bites and making your foods moist if still having the chest pressure take your acid reducer and if relief let your surgeon know  Handouts Previously Provided Include  Phase 7  Learning Style & Readiness for Change Teaching method utilized: Visual & Auditory  Demonstrated degree of understanding via: Teach Back  Readiness Level: action Barriers to learning/adherence to lifestyle change: none identified   RD's Notes for Next Visit Assess adherence to pt chosen goals    MONITORING & EVALUATION Dietary intake, weekly physical activity, body weight  Next Steps Patient is to follow-up in 2-3 months

## 2022-09-04 ENCOUNTER — Ambulatory Visit: Payer: BC Managed Care – PPO | Admitting: Skilled Nursing Facility1

## 2022-10-02 ENCOUNTER — Encounter: Payer: Self-pay | Admitting: Skilled Nursing Facility1

## 2022-10-02 ENCOUNTER — Encounter: Payer: BC Managed Care – PPO | Attending: Physician Assistant | Admitting: Skilled Nursing Facility1

## 2022-10-02 VITALS — Ht 60.0 in | Wt 123.0 lb

## 2022-10-02 DIAGNOSIS — E669 Obesity, unspecified: Secondary | ICD-10-CM | POA: Diagnosis not present

## 2022-10-02 NOTE — Progress Notes (Signed)
Bariatric Nutrition Follow-Up Visit Medical Nutrition Therapy    NUTRITION ASSESSMENT    Surgery date: 11/26/2021 Surgery type: RYGB Start weight at [facility]: 190 pounds Weight today: 123 pounds   Clinical  Medical hx: GERD, hyperlipidemia, HTN Medications: see list; omeprazole   Labs: iron saturation 14 Notable signs/symptoms: constipation  Any previous deficiencies? No   Body Composition Scale 12/11/2021 01/22/2022 04/17/2022 06/20/2022 10/02/2022  Current Body Weight 179.1 161 137.3 131 123  Total Body Fat % 40.4 37.2 32 27.6 27.8  Visceral Fat 13 11 8 7 6   Fat-Free Mass % 59.5 62.7 67.9 72.3 72.1   Total Body Water % 44.2 45.8 48.4 50.6 50.5  Muscle-Mass lbs 27 26.8 26.3 27.9 26.0  BMI 34.3 30.8 26.3 25 23.7  Body Fat Displacement              Torso  lbs 44.7 36.9 27 22.3 21.0         Left Leg  lbs 8.9 7.3 5.4 4.4 4.2         Right Leg  lbs 8.9 7.3 5.4 4.4 4.2         Left Arm  lbs 4.4 3.6 2.7 2.2 2.1         Right Arm   lbs 4.4 3.6 2.7 2.2 2.1     Lifestyle & Dietary Hx  Pt states her nails have been brittle but feels they are getting better. Pt states she had labs taken early today. Pt states she is still taking Miralax several times a week and is usually having a bowel movement every day. Pt states being slightly forgetful of words sometimes. Pt states she is willing to cut back on water and eat more throughout the day.  Pts family has expressed that they do not feel like she needs to lose any more weight. Pt states she would be willing to continue to add breakfast meal to her day (oatmeal, cereal, grits, whole wheat toast with avocado, waffle or pancake on weekend) Pt states she walks 3 times a week for 30 minutes. Pt states she was going to the gym, but her schedule has gotten busy from the holidays. Pt states she has suffered from headaches lately.   Estimated daily fluid intake: 96 oz Estimated daily protein intake: 80 g Supplements: multi: and  calcium Current average weekly physical activity: walking 3 days 30 minutes   24-Hr Dietary Recall First Meal: protein shake (fairlife) + oatmeal (packet apple cinnamon) Snack: None. Second Meal: yogurt + pack with cheese, nuts, and cranberries + sometimes an orange or clementine  Snack: cashews + cranberries (100 calorie pack) OR protein bar Third Meal: 1 medium piece of pizza  Snack: crackers + cheese Beverages: 80+ ounces water (water flavorings), sometimes decaf coffee or unsweet tea  Post-Op Goals/ Signs/ Symptoms Using straws: no Drinking while eating: no Chewing/swallowing difficulties: no Changes in vision: no Changes to mood/headaches: no Hair loss/changes to skin/nails: yes Difficulty focusing/concentrating: no Sweating: no Limb weakness: no Dizziness/lightheadedness: no Palpitations: no  Carbonated/caffeinated beverages: no N/V/D/C/Gas: taking mirilax throughout the week Abdominal pain: no Dumping syndrome: no    NUTRITION DIAGNOSIS  Overweight/obesity ([place]-3.3) related to past poor dietary habits and physical inactivity as evidenced by completed bariatric surgery and following dietary guidelines for continued weight loss and healthy nutrition status.     NUTRITION INTERVENTION Nutrition counseling (C-1) and education (E-2) to facilitate bariatric surgery goals, including: The importance of consuming adequate calories as well as certain nutrients daily due to  the body's need for essential vitamins, minerals, and fats The importance of daily physical activity and to reach a goal of at least 150 minutes of moderate to vigorous physical activity weekly (or as directed by their physician) due to benefits such as increased musculature and improved lab values The importance of intuitive eating specifically learning hunger-satiety cues and understanding the importance of learning a new body: The importance of mindful eating to avoid grazing behaviors  Encouraged patient to  honor their body's internal hunger and fullness cues.  Throughout the day, check in mentally and rate hunger. Stop eating when satisfied not full regardless of how much food is left on the plate.  Get more if still hungry 20-30 minutes later.  The key is to honor satisfaction so throughout the meal, rate fullness factor and stop when comfortably satisfied not physically full. The key is to honor hunger and fullness without any feelings of guilt or shame.  Pay attention to what the internal cues are, rather than any external factors. This will enhance the confidence you have in listening to your own body and following those internal cues enabling you to increase how often you eat when you are hungry not out of appetite and stop when you are satisfied not full.  Encouraged pt to continue to eat balanced meals inclusive of non starchy vegetables 2 times a day 7 days a week Encouraged pt to choose lean protein sources: limiting beef, pork, sausage, hotdogs, and lunch meat Encourage pt to choose healthy fats such as plant based limiting animal fats Encouraged pt to continue to drink a minium 64 fluid ounces with half being plain water to satisfy proper hydration  Why you need complex carbohydrates: Whole grains and other complex carbohydrates are required to have a healthy diet. Whole grains provide fiber which can help with blood glucose levels and help keep you satiated. Fruits and starchy vegetables provide essential vitamins and minerals required for immune function, eyesight support, brain support, bone density, wound healing and many other functions within the body. According to the current evidenced based 2020-2025 Dietary Guidelines for Americans, complex carbohydrates are part of a healthy eating pattern which is associated with a decreased risk for type 2 diabetes, cancers, and cardiovascular disease.   Goals: - Adding some extra food for breakfast (oatmeal, cereal, or avocado toast) and lunch meals  (non-starchy vegetable and hummus). - Cutting back on water to effectively feel hunger que's and increase food intake throughout the day.  Handouts Previously Provided Include  Phase 7  Learning Style & Readiness for Change Teaching method utilized: Visual & Auditory  Demonstrated degree of understanding via: Teach Back  Readiness Level: action Barriers to learning/adherence to lifestyle change: none identified   RD's Notes for Next Visit Assess adherence to pt chosen goals    MONITORING & EVALUATION Dietary intake, weekly physical activity, body weight  Next Steps Patient is to follow-up in 2 months.

## 2022-10-29 ENCOUNTER — Telehealth: Payer: BC Managed Care – PPO | Admitting: Adult Health

## 2022-11-12 ENCOUNTER — Ambulatory Visit: Payer: BC Managed Care – PPO | Admitting: Skilled Nursing Facility1

## 2022-11-12 ENCOUNTER — Encounter: Payer: BC Managed Care – PPO | Attending: Physician Assistant | Admitting: Skilled Nursing Facility1

## 2022-11-12 ENCOUNTER — Encounter: Payer: Self-pay | Admitting: Skilled Nursing Facility1

## 2022-11-12 VITALS — Ht 60.5 in | Wt 123.8 lb

## 2022-11-12 DIAGNOSIS — E669 Obesity, unspecified: Secondary | ICD-10-CM | POA: Diagnosis present

## 2022-11-12 NOTE — Progress Notes (Signed)
Bariatric Nutrition Follow-Up Visit Medical Nutrition Therapy    NUTRITION ASSESSMENT    Surgery date: 11/26/2021 Surgery type: RYGB Start weight at [facility]: 190 pounds Weight today: 123 pounds   Clinical  Medical hx: GERD, hyperlipidemia, HTN Medications: see list; omeprazole   Labs: iron saturation 14 Notable signs/symptoms: constipation  Any previous deficiencies? No   Body Composition Scale 12/11/2021 01/22/2022 04/17/2022 06/20/2022 10/02/2022 11/12/2022  Current Body Weight 179.1 161 137.3 131 123 123.8  Total Body Fat % 40.4 37.2 32 27.6 27.8 25.4  Visceral Fat 13 11 8 7 6 6   Fat-Free Mass % 59.5 62.7 67.9 72.3 72.1 74.5   Total Body Water % 44.2 45.8 48.4 50.6 50.5 51.7  Muscle-Mass lbs 27 26.8 26.3 27.9 26.0 27.7  BMI 34.3 30.8 26.3 25 23.7 23.5  Body Fat Displacement               Torso  lbs 44.7 36.9 27 22.3 21.0 19.3         Left Leg  lbs 8.9 7.3 5.4 4.4 4.2 3.8         Right Leg  lbs 8.9 7.3 5.4 4.4 4.2 3.8         Left Arm  lbs 4.4 3.6 2.7 2.2 2.1 1.9         Right Arm   lbs 4.4 3.6 2.7 2.2 2.1 1.9     Lifestyle & Dietary Hx  Pt states she is no longer experiencing headaches.  Pt states she was prescribed acid reduce that she takes as needed. Pt states her hair is starting to grow back.  Pt states after her activities she feels energized.  Pt states she just got back from vacation where she did a lot of walking on the beach. Pt states she feels her eating schedule will get back to "normal" once she goes back to work. Pt states she felt guilt after eating a small piece of fudge on New Year's. Pt states she feels like she eats very often throughout the day.  Estimated daily fluid intake: 64 oz Estimated daily protein intake: 80 g Supplements: multi: and calcium Current average weekly physical activity: gym 3-5 times/week; cardio and resistance training    24-Hr Dietary Recall First Meal: chex cereal + almond milk Snack: 2 cheese squares + 3  crackers Second Meal: None reported.  Snack: cream cheese + pepper jelly + 1 cracker  Third Meal: black eyed peas + greens + cornbread muffin + [place] breast + side meat Snack: crackers + cheese Beverages: 64 ounces water (water flavorings), sometimes decaf coffee or unsweet tea, sugar free hot chocolates   Post-Op Goals/ Signs/ Symptoms Using straws: no Drinking while eating: no Chewing/swallowing difficulties: no Changes in vision: no Changes to mood/headaches: no Hair loss/changes to skin/nails: pt states her hair is starting to grow back Difficulty focusing/concentrating: no Sweating: no Limb weakness: no Dizziness/lightheadedness: no Palpitations: no  Carbonated/caffeinated beverages: no N/V/D/C/Gas: taking mirilax throughout the week; bowel movements every other day with strain  Abdominal pain: no Dumping syndrome: no    NUTRITION DIAGNOSIS  Overweight/obesity ([place]-3.3) related to past poor dietary habits and physical inactivity as evidenced by completed bariatric surgery and following dietary guidelines for continued weight loss and healthy nutrition status.     NUTRITION INTERVENTION Nutrition counseling (C-1) and education (E-2) to facilitate bariatric surgery goals, including: The importance of consuming adequate calories as well as certain nutrients daily due to the body's need for essential vitamins, minerals, and fats  The importance of daily physical activity and to reach a goal of at least 150 minutes of moderate to vigorous physical activity weekly (or as directed by their physician) due to benefits such as increased musculature and improved lab values The importance of intuitive eating specifically learning hunger-satiety cues and understanding the importance of learning a new body: The importance of mindful eating to avoid grazing behaviors  Encouraged patient to honor their body's internal hunger and fullness cues.  Throughout the day, check in mentally and rate  hunger. Stop eating when satisfied not full regardless of how much food is left on the plate.  Get more if still hungry 20-30 minutes later.  The key is to honor satisfaction so throughout the meal, rate fullness factor and stop when comfortably satisfied not physically full. The key is to honor hunger and fullness without any feelings of guilt or shame.  Pay attention to what the internal cues are, rather than any external factors. This will enhance the confidence you have in listening to your own body and following those internal cues enabling you to increase how often you eat when you are hungry not out of appetite and stop when you are satisfied not full.  Encouraged pt to continue to eat balanced meals inclusive of non starchy vegetables 2 times a day 7 days a week Encouraged pt to choose lean protein sources: limiting beef, pork, sausage, hotdogs, and lunch meat Encourage pt to choose healthy fats such as plant based limiting animal fats Encouraged pt to continue to drink a minium 64 fluid ounces with half being plain water to satisfy proper hydration  Why you need complex carbohydrates: Whole grains and other complex carbohydrates are required to have a healthy diet. Whole grains provide fiber which can help with blood glucose levels and help keep you satiated. Fruits and starchy vegetables provide essential vitamins and minerals required for immune function, eyesight support, brain support, bone density, wound healing and many other functions within the body. According to the current evidenced based 2020-2025 Dietary Guidelines for Americans, complex carbohydrates are part of a healthy eating pattern which is associated with a decreased risk for type 2 diabetes, cancers, and cardiovascular disease.   Goals: - Continued: Adding some extra food for breakfast (oatmeal, cereal, or avocado toast) and lunch meals (non-starchy vegetable and hummus). - Continued: Cutting back on water to effectively feel  hunger que's and increase food intake throughout the day. - Complete mindful meals activity and bring it back at the next visit. - Check every day the number of times eating occurs.   Handouts Previously Provided Include  Mindful Meals  Learning Style & Readiness for Change Teaching method utilized: Visual & Auditory  Demonstrated degree of understanding via: Teach Back  Readiness Level: action Barriers to learning/adherence to lifestyle change: none identified   RD's Notes for Next Visit Assess adherence to pt chosen goals    MONITORING & EVALUATION Dietary intake, weekly physical activity, body weight.  Next Steps Patient is to follow-up in 3 months.

## 2023-02-10 ENCOUNTER — Encounter: Payer: Self-pay | Admitting: Dietician

## 2023-02-10 ENCOUNTER — Encounter: Payer: BC Managed Care – PPO | Attending: Physician Assistant | Admitting: Dietician

## 2023-02-10 VITALS — Ht 60.5 in | Wt 124.5 lb

## 2023-02-10 DIAGNOSIS — E669 Obesity, unspecified: Secondary | ICD-10-CM | POA: Diagnosis present

## 2023-02-10 NOTE — Progress Notes (Signed)
Bariatric Nutrition Follow-Up Visit Medical Nutrition Therapy   NUTRITION ASSESSMENT  Surgery date: 11/26/2021 Surgery type: RYGB Start weight at [facility]: 190 pounds Weight today: 124.5 pounds  Clinical  Medical hx: GERD, hyperlipidemia, HTN Medications: see list; omeprazole   Labs: iron saturation 14 Notable signs/symptoms: none noted Any previous deficiencies? No   Body Composition Scale 12/11/2021 01/22/2022 04/17/2022 06/20/2022 10/02/2022 11/12/2022 02/10/2023  Current Body Weight 179.1 161 137.3 131 123 123.8 124.5  Total Body Fat % 40.4 37.2 32 27.6 27.8 25.4 25.9  Visceral Fat 13 11 8 7 6 6 6   Fat-Free Mass % 59.5 62.7 67.9 72.3 72.1 74.5 74.0   Total Body Water % 44.2 45.8 48.4 50.6 50.5 51.7 51.5  Muscle-Mass lbs 27 26.8 26.3 27.9 26.0 27.7 27.6  BMI 34.3 30.8 26.3 25 23.7 23.5 23.7  Body Fat Displacement                Torso  lbs 44.7 36.9 27 22.3 21.0 19.3 19.8         Left Leg  lbs 8.9 7.3 5.4 4.4 4.2 3.8 3.9         Right Leg  lbs 8.9 7.3 5.4 4.4 4.2 3.8 3.9         Left Arm  lbs 4.4 3.6 2.7 2.2 2.1 1.9 1.9         Right Arm   lbs 4.4 3.6 2.7 2.2 2.1 1.9 1.9     Lifestyle & Dietary Hx  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 Pt states she does well with chicken and ham, stating she does not like to eat beef anymore. Pt states she feels the need to get out and exercise and states she has more energy now, stating before surgery she dreaded the idea of exercising. Pt states she is still drinking a protein shake for first meal in the morning, stating during the week she is more pressed for time.  Pt states she will have oatmeal on the weekend when she has more time to prepare.  Estimated daily fluid intake: 64 oz Estimated daily protein intake: 75 g Supplements: multi: and calcium Current average weekly physical activity: gym 3 times/week; cardio and resistance training    24-Hr Dietary Recall First Meal: protein shake Snack: sometimes a banana or  apple Second Meal: yogurt with granola or snack pack Snack: almond pack Snack: protein bar Third Meal: black eyed peas + greens + cornbread muffin + [place] breast + side meat Snack: crackers + cheese Beverages: 64 ounces water (water flavorings), sometimes decaf coffee or unsweet tea, sugar free hot chocolates   Post-Op Goals/ Signs/ Symptoms Using straws: no Drinking while eating: no Chewing/swallowing difficulties: no Changes in vision: no Changes to mood/headaches: no Hair loss/changes to skin/nails: pt states her hair is starting to grow back Difficulty focusing/concentrating: no Sweating: no Limb weakness: no Dizziness/lightheadedness: no Palpitations: no  Carbonated/caffeinated beverages: no N/V/D/C/Gas: taking mirilax throughout the week; bowel movements every other day with strain  Abdominal pain: no Dumping syndrome: no    NUTRITION DIAGNOSIS  Overweight/obesity ([place]-3.3) related to past poor dietary habits and physical inactivity as evidenced by completed bariatric surgery and following dietary guidelines for continued weight loss and healthy nutrition status.     NUTRITION INTERVENTION Nutrition counseling (C-1) and education (E-2) to facilitate bariatric surgery goals, including: The importance of consuming adequate calories as well as certain nutrients daily due to the body's need for essential vitamins, minerals, and fats The importance  of daily physical activity and to reach a goal of at least 150 minutes of moderate to vigorous physical activity weekly (or as directed by their physician) due to benefits such as increased musculature and improved lab values The importance of intuitive eating specifically learning hunger-satiety cues and understanding the importance of learning a new body: The importance of mindful eating to avoid grazing behaviors  Encouraged patient to honor their body's internal hunger and fullness cues.  Throughout the day, check in mentally and  rate hunger. Stop eating when satisfied not full regardless of how much food is left on the plate.  Get more if still hungry 20-30 minutes later.  The key is to honor satisfaction so throughout the meal, rate fullness factor and stop when comfortably satisfied not physically full. The key is to honor hunger and fullness without any feelings of guilt or shame.  Pay attention to what the internal cues are, rather than any external factors. This will enhance the confidence you have in listening to your own body and following those internal cues enabling you to increase how often you eat when you are hungry not out of appetite and stop when you are satisfied not full.  Encouraged pt to continue to eat balanced meals inclusive of non starchy vegetables 2 times a day 7 days a week Encouraged pt to choose lean protein sources: limiting beef, pork, sausage, hotdogs, and lunch meat Encourage pt to choose healthy fats such as plant based limiting animal fats Encouraged pt to continue to drink a minium 64 fluid ounces with half being plain water to satisfy proper hydration  Why you need complex carbohydrates: Whole grains and other complex carbohydrates are required to have a healthy diet. Whole grains provide fiber which can help with blood glucose levels and help keep you satiated. Fruits and starchy vegetables provide essential vitamins and minerals required for immune function, eyesight support, brain support, bone density, wound healing and many other functions within the body. According to the current evidenced based 2020-2025 Dietary Guidelines for Americans, complex carbohydrates are part of a healthy eating pattern which is associated with a decreased risk for type 2 diabetes, cancers, and cardiovascular disease.   Goals: - Continue: Adding some extra food for breakfast (oatmeal, cereal, or avocado toast) and lunch meals (non-starchy vegetable and hummus). - New: meal prep the night before to be able to have  time to eat breakfast. - Continue: non starchy vegetables 2 times a day 7 days a week  Handouts Previously Provided Include  Bariatric MyPlate  Learning Style & Readiness for Change Teaching method utilized: Visual & Auditory  Demonstrated degree of understanding via: Teach Back  Readiness Level: action Barriers to learning/adherence to lifestyle change: none identified   RD's Notes for Next Visit Assess adherence to pt chosen goals   MONITORING & EVALUATION Dietary intake, weekly physical activity, body weight.  Next Steps Patient is to follow-up in 3 months.

## 2023-02-17 ENCOUNTER — Ambulatory Visit: Payer: BC Managed Care – PPO | Admitting: Skilled Nursing Facility1

## 2023-02-21 IMAGING — CR DG CHEST 2V
2 series · 2 of 2 positions shown · non-contrast
Comparison: No priors.

CLINICAL DATA: 55-year-old female with history of gastroesophageal
reflux disease. Preoperative study prior to potential bariatric
surgery.

EXAM:
CHEST - 2 VIEW

[chest pa]
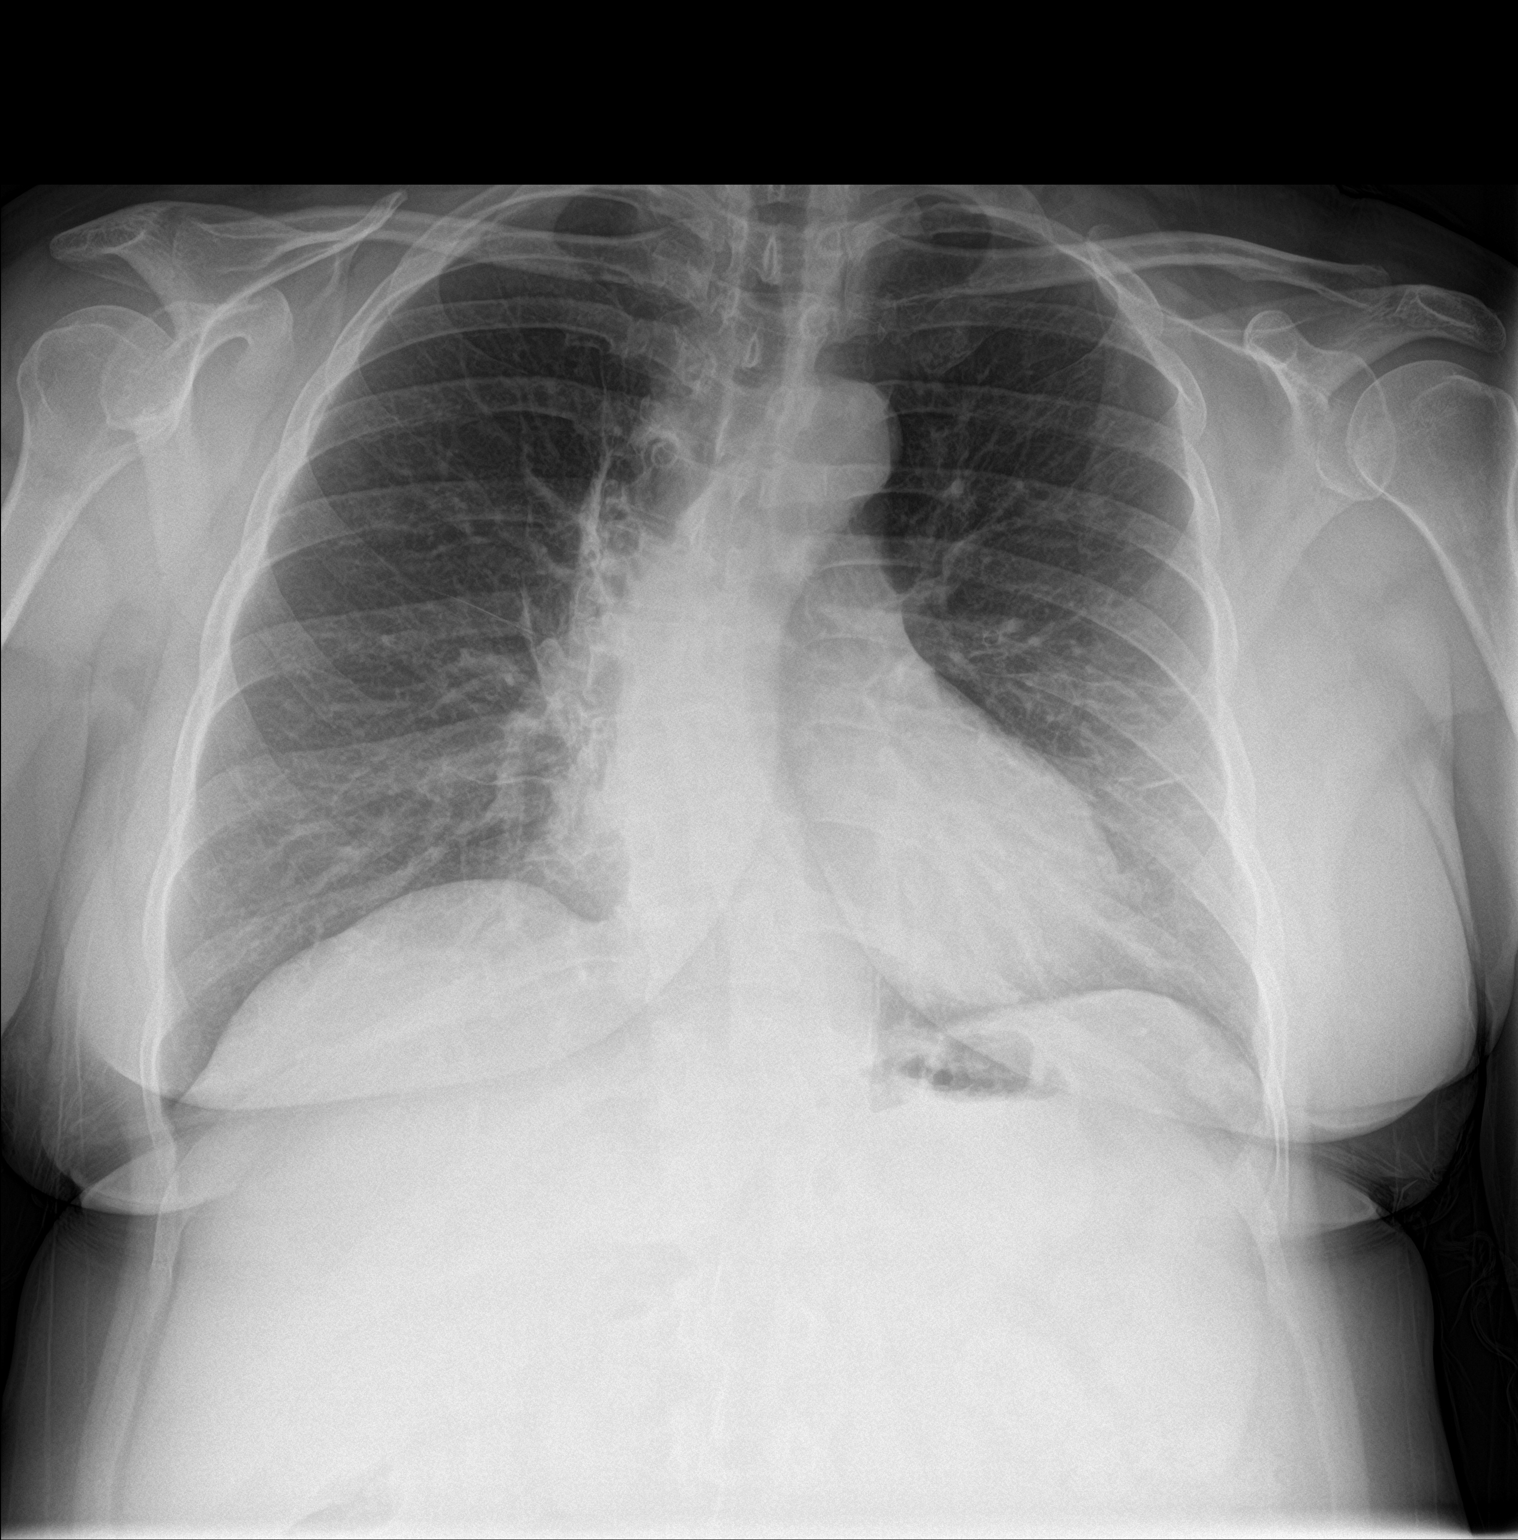

[chest lat]
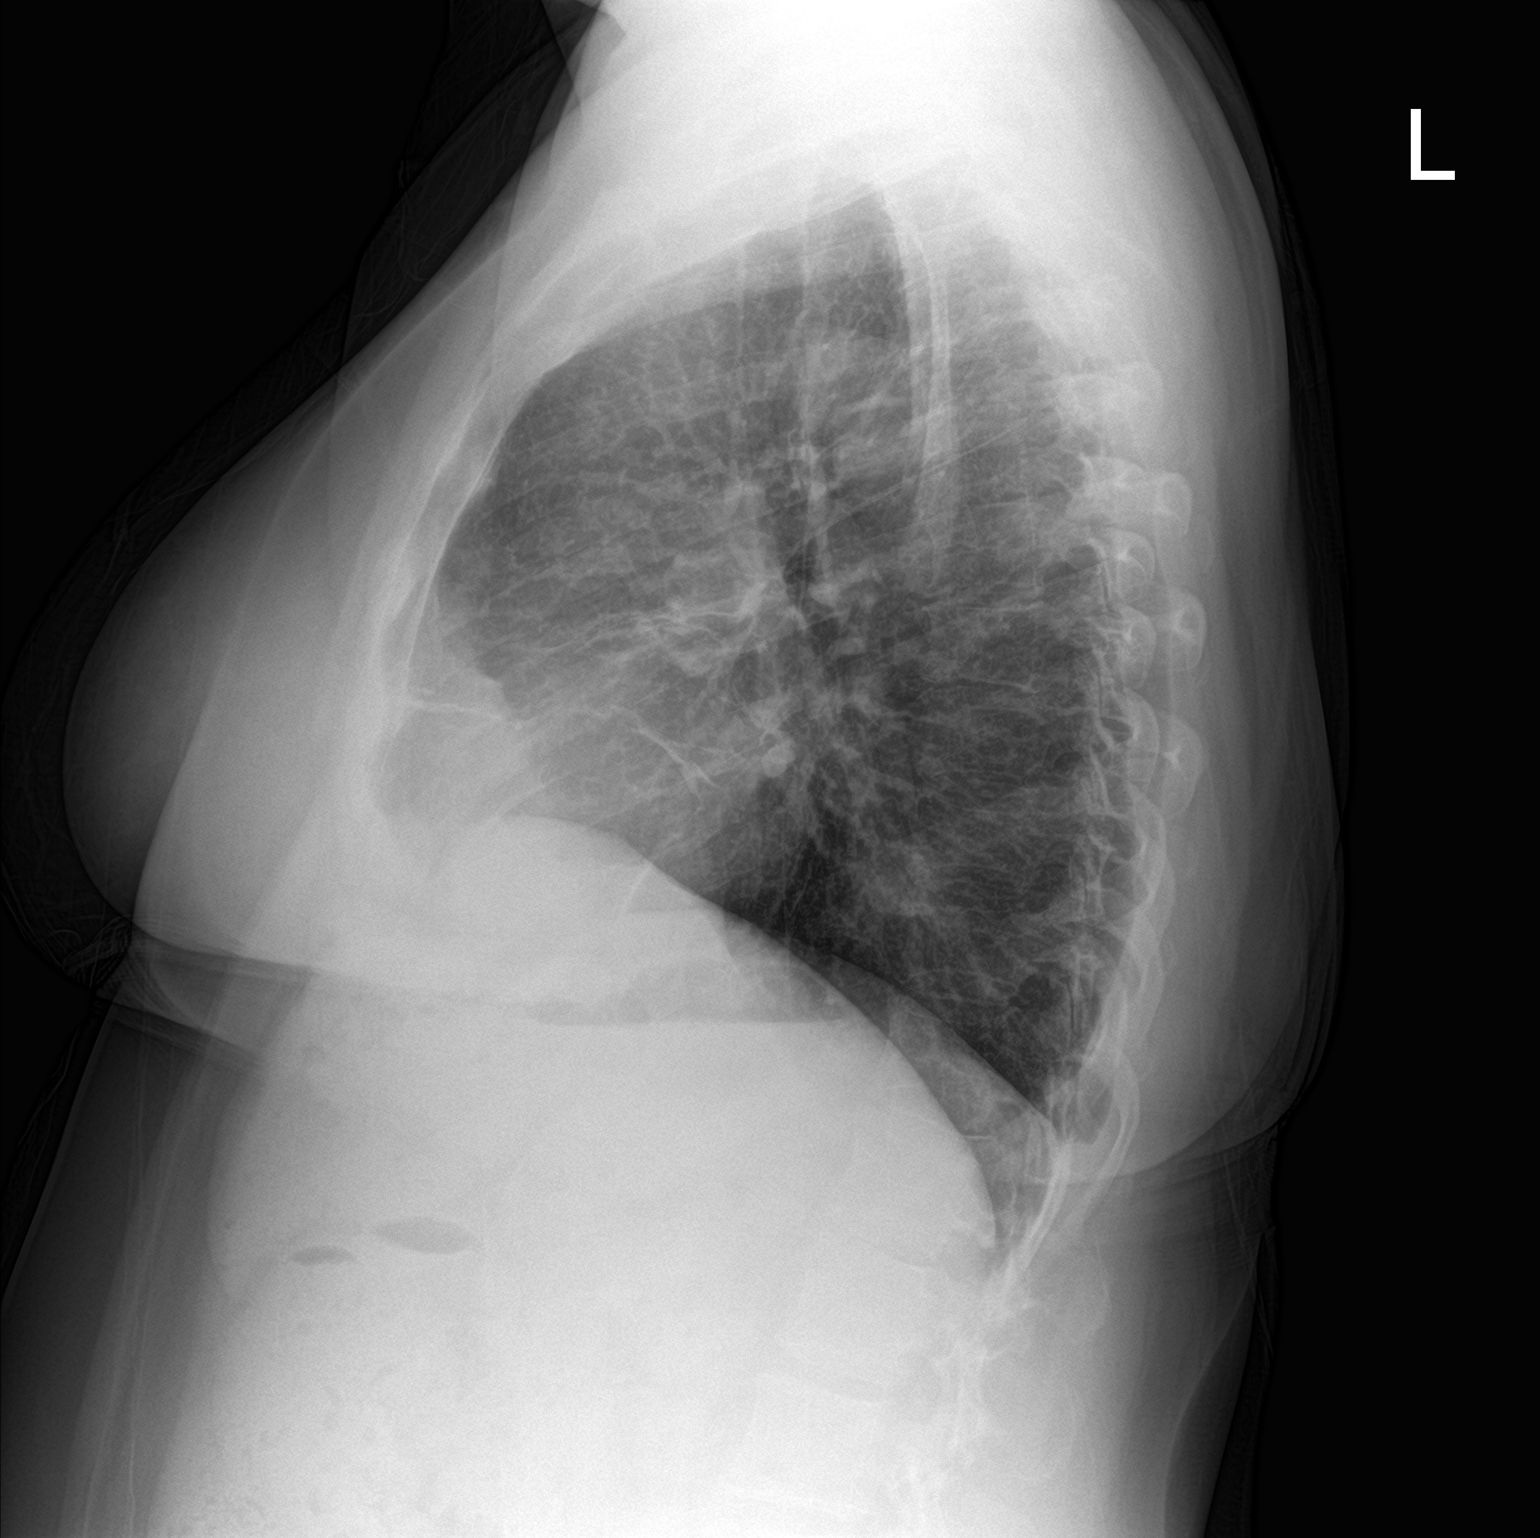

[2 of 2 positions shown; findings below may reference images not displayed]

FINDINGS: Lung volumes are normal. No consolidative airspace disease. No
pleural effusions. No pneumothorax. Mild linear scarring or
atelectasis is noted in the region of the lingula. No pulmonary
nodule or mass noted. Pulmonary vasculature and the
cardiomediastinal silhouette are within normal limits.
IMPRESSION: 1. No radiographic evidence of acute cardiopulmonary disease.

## 2023-05-12 ENCOUNTER — Ambulatory Visit: Payer: BC Managed Care – PPO | Admitting: Skilled Nursing Facility1

## 2023-05-23 ENCOUNTER — Encounter: Payer: Self-pay | Admitting: Dietician

## 2023-05-23 ENCOUNTER — Encounter: Payer: BC Managed Care – PPO | Attending: Physician Assistant | Admitting: Dietician

## 2023-05-23 VITALS — Ht 60.5 in | Wt 124.1 lb

## 2023-05-23 DIAGNOSIS — E669 Obesity, unspecified: Secondary | ICD-10-CM | POA: Diagnosis present

## 2023-05-23 NOTE — Progress Notes (Signed)
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 No   Body Composition Scale 12/11/2021 01/22/2022 04/17/2022 06/20/2022 10/02/2022 11/12/2022 02/10/2023 05/23/2023  Current Body Weight 179.1 161 137.3 131 123 123.8 124.5 124.1  Total Body Fat % 40.4 37.2 32 27.6 27.8 25.4 25.9 28.0  Visceral Fat 13 11 8 7 6 6 6 6   Fat-Free Mass % 59.5 62.7 67.9 72.3 72.1 74.5 74.0 71.9   Total Body Water % 44.2 45.8 48.4 50.6 50.5 51.7 51.5 50.4  Muscle-Mass lbs 27 26.8 26.3 27.9 26.0 27.7 27.6 26.2  BMI 34.3 30.8 26.3 25 23.7 23.5 23.7 23.6  Body Fat Displacement                 Torso  lbs 44.7 36.9 27 22.3 21.0 19.3 19.8 21.3         Left Leg  lbs 8.9 7.3 5.4 4.4 4.2 3.8 3.9 4.2         Right Leg  lbs 8.9 7.3 5.4 4.4 4.2 3.8 3.9 4.2         Left Arm  lbs 4.4 3.6 2.7 2.2 2.1 1.9 1.9 2.1         Right Arm   lbs 4.4 3.6 2.7 2.2 2.1 1.9 1.9 2.0   Lifestyle & Dietary Hx  Pt states she has been out of town a lot lately, stating her schedule and routine has been different. Pt states she has been active, but not consistent with physical activity. Pt states since vacations are over, she will be able to get back on track with tracking and getting back to routine. Pt states she has had heartburn lately when her stomach is empty.  Dietitian advised to avoid skipping meals and include small snacks as needed.  Estimated daily fluid intake: 64 oz Estimated daily protein intake: 75 g Supplements: multi: and calcium Current average weekly physical activity: gym 3 times/week; cardio and resistance training    24-Hr Dietary Recall First Meal: protein shake Snack: sometimes a banana or  apple Second Meal: yogurt with granola or snack pack Snack: almond pack Snack: protein bar Third Meal: black eyed peas + greens + cornbread muffin + [place] breast + side meat Snack: crackers + cheese Beverages: 64 ounces water (water flavorings), sometimes decaf coffee or unsweet tea, sugar free hot chocolates   Post-Op Goals/ Signs/ Symptoms Using straws: no Drinking while eating: no Chewing/swallowing difficulties: no Changes in vision: no Changes to mood/headaches: no Hair loss/changes to skin/nails: pt states her hair is starting to grow back Difficulty focusing/concentrating: no Sweating: no Limb weakness: no Dizziness/lightheadedness: no Palpitations: no  Carbonated/caffeinated beverages: no N/V/D/C/Gas: taking mirilax throughout the week; bowel movements every other day with strain  Abdominal pain: no Dumping syndrome: no   NUTRITION DIAGNOSIS  Overweight/obesity ([place]-3.3) related to past poor dietary habits and physical inactivity as evidenced by completed bariatric surgery and following dietary guidelines for continued weight loss and healthy nutrition status.   NUTRITION INTERVENTION Nutrition counseling (C-1) and education (E-2) to facilitate bariatric surgery goals, including: The importance of consuming adequate calories as well as certain nutrients daily due to the body's need for essential vitamins, minerals, and fats The importance of daily physical activity and to reach a goal  of at least 150 minutes of moderate to vigorous physical activity weekly (or as directed by their physician) due to benefits such as increased musculature and improved lab values The importance of intuitive eating specifically learning hunger-satiety cues and understanding the importance of learning a new body: The importance of mindful eating to avoid grazing behaviors  Encouraged patient to honor their body's internal hunger and fullness cues.  Throughout the day, check in mentally and rate  hunger. Stop eating when satisfied not full regardless of how much food is left on the plate.  Get more if still hungry 20-30 minutes later.  The key is to honor satisfaction so throughout the meal, rate fullness factor and stop when comfortably satisfied not physically full. The key is to honor hunger and fullness without any feelings of guilt or shame.  Pay attention to what the internal cues are, rather than any external factors. This will enhance the confidence you have in listening to your own body and following those internal cues enabling you to increase how often you eat when you are hungry not out of appetite and stop when you are satisfied not full.  Encouraged pt to continue to eat balanced meals inclusive of non starchy vegetables 2 times a day 7 days a week Encouraged pt to choose lean protein sources: limiting beef, pork, sausage, hotdogs, and lunch meat Encourage pt to choose healthy fats such as plant based limiting animal fats Encouraged pt to continue to drink a minium 64 fluid ounces with half being plain water to satisfy proper hydration  Why you need complex carbohydrates: Whole grains and other complex carbohydrates are required to have a healthy diet. Whole grains provide fiber which can help with blood glucose levels and help keep you satiated. Fruits and starchy vegetables provide essential vitamins and minerals required for immune function, eyesight support, brain support, bone density, wound healing and many other functions within the body. According to the current evidenced based 2020-2025 Dietary Guidelines for Americans, complex carbohydrates are part of a healthy eating pattern which is associated with a decreased risk for type 2 diabetes, cancers, and cardiovascular disease.   Goals: - Continue: Adding some extra food for breakfast (oatmeal, cereal, or avocado toast) and lunch meals (non-starchy vegetable and hummus) - Continue: meal prep the night before to be able to have  time to eat breakfast - Continue: non starchy vegetables 2 times a day 7 days a week - New: get back normal routine at the gym and meal patterns  Handouts Previously Provided Include  Health Benefits of Physical Activity  Learning Style & Readiness for Change Teaching method utilized: Visual & Auditory  Demonstrated degree of understanding via: Teach Back  Readiness Level: action Barriers to learning/adherence to lifestyle change: none identified   RD's Notes for Next Visit Assess adherence to pt chosen goals   MONITORING & EVALUATION Dietary intake, weekly physical activity, body weight.  Next Steps Patient is to follow-up in 3 months.

## 2023-12-01 ENCOUNTER — Encounter: Payer: Self-pay | Admitting: Dietician

## 2023-12-01 ENCOUNTER — Encounter: Payer: 59 | Attending: Surgery | Admitting: Dietician

## 2023-12-01 VITALS — Ht 60.5 in | Wt 124.4 lb

## 2023-12-01 DIAGNOSIS — E669 Obesity, unspecified: Secondary | ICD-10-CM | POA: Diagnosis present

## 2023-12-01 NOTE — Progress Notes (Signed)
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 No   Body Composition Scale 12/11/2021 01/22/2022 04/17/2022 06/20/2022 10/02/2022 11/12/2022 02/10/2023 05/23/2023 12/01/2023  Current Body Weight 179.1 161 137.3 131 123 123.8 124.5 124.1 124.4  Total Body Fat % 40.4 37.2 32 27.6 27.8 25.4 25.9 28.0 28.0  Visceral Fat 13 11 8 7 6 6 6 6 6   Fat-Free Mass % 59.5 62.7 67.9 72.3 72.1 74.5 74.0 71.9 71.9   Total Body Water % 44.2 45.8 48.4 50.6 50.5 51.7 51.5 50.4 50.4  Muscle-Mass lbs 27 26.8 26.3 27.9 26.0 27.7 27.6 26.2 26.2  BMI 34.3 30.8 26.3 25 23.7 23.5 23.7 23.6 23.7  Body Fat Displacement                  Torso  lbs 44.7 36.9 27 22.3 21.0 19.3 19.8 21.3 21.4         Left Leg  lbs 8.9 7.3 5.4 4.4 4.2 3.8 3.9 4.2 4.2         Right Leg  lbs 8.9 7.3 5.4 4.4 4.2 3.8 3.9 4.2 4.2         Left Arm  lbs 4.4 3.6 2.7 2.2 2.1 1.9 1.9 2.1 2.1         Right Arm   lbs 4.4 3.6 2.7 2.2 2.1 1.9 1.9 2.1 2.1   Lifestyle & Dietary Hx  Pt states she had a cyst removed from her back, stating she hasn't been lifting weights. Pt states she also had her first grand baby in the fall (Oct) Pt states sometimes she gets shaky, stating she thinks her blood sugar may be low. Pt states she does not use her CPAP machine, stating she still snores but does not know if she still needs it for sleep apnea.  Estimated daily fluid intake: 80 oz Estimated daily protein intake: 75 g Supplements: multi: and calcium Current average weekly physical activity: gym 3 times/week; cardio and resistance training    24-Hr Dietary Recall First Meal: protein  shake Snack: sometimes a banana or apple Second Meal: yogurt with granola or snack pack Snack: cranberries, nuts, cheese Snack: protein bar or nuts with dried fruit Third Meal: black eyed peas + greens + cornbread muffin + [place] breast + side meat or white chicken chili Snack: crackers + cheese or no sugar added fudge pop and fruit Beverages: 64 ounces water (water flavorings), sometimes decaf coffee or unsweet tea, sugar free hot chocolates   Post-Op Goals/ Signs/ Symptoms Using straws: no Drinking while eating: no Chewing/swallowing difficulties: no Changes in vision: no Changes to mood/headaches: no Hair loss/changes to skin/nails: pt states her hair is starting to grow back Difficulty focusing/concentrating: no Sweating: no Limb weakness: no Dizziness/lightheadedness: no Palpitations: no  Carbonated/caffeinated beverages: no N/V/D/C/Gas: taking mirilax throughout the week; bowel movements every other day with strain  Abdominal pain: no Dumping syndrome: no   NUTRITION DIAGNOSIS  Overweight/obesity ([place]-3.3) related to past poor dietary habits and physical inactivity as evidenced by completed bariatric surgery and following dietary guidelines for continued weight loss and healthy nutrition status.   NUTRITION INTERVENTION Nutrition counseling (C-1) and education (E-2) to facilitate bariatric surgery goals, including: The importance  of consuming adequate calories as well as certain nutrients daily due to the body's need for essential vitamins, minerals, and fats The importance of daily physical activity and to reach a goal of at least 150 minutes of moderate to vigorous physical activity weekly (or as directed by their physician) due to benefits such as increased musculature and improved lab values The importance of intuitive eating specifically learning hunger-satiety cues and understanding the importance of learning a new body: The importance of mindful eating to avoid grazing  behaviors  Encouraged pt to have a protein with every meal or snack, and increase non-starchy vegetables. Reviewed the bariatric MyPlate. Encouraged pt to eat frequently throughout the day. Do not under consume.  Goals: - Re-engage/Continue: Adding some extra food for breakfast (oatmeal, cereal, or avocado toast) and lunch meals (non-starchy vegetable and hummus) - Continue: meal prep the night before to be able to have time to eat breakfast - Continue: non starchy vegetables 2 times a day 7 days a week - Re-engage: get back normal routine at the gym and meal patterns  Handouts Previously Provided Include  Bariatric MyPlate Body Comp Scale readout  Learning Style & Readiness for Change Teaching method utilized: Visual & Auditory  Demonstrated degree of understanding via: Teach Back  Readiness Level: action Barriers to learning/adherence to lifestyle change: none identified   RD's Notes for Next Visit Assess adherence to pt chosen goals   MONITORING & EVALUATION Dietary intake, weekly physical activity, body weight.  Next Steps Patient is to follow-up if needed/desired after consulting with surgeon at surgery follow-up.
